# Patient Record
Sex: Female | Born: 1985 | Race: White | Hispanic: No | Marital: Married
Health system: Midwestern US, Community
[De-identification: ages and names within clinical notes are randomized; demographics above are authoritative.]

## PROBLEM LIST (undated history)

## (undated) HISTORY — PX: TUBAL LIGATION: SHX77

---
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
## 2003-01-21 ENCOUNTER — Ambulatory Visit (HOSPITAL_COMMUNITY): Admission: RE | Admit: 2003-01-21 | Discharge: 2003-01-21 | Payer: Self-pay | Admitting: *Deleted

## 2003-02-28 ENCOUNTER — Ambulatory Visit (HOSPITAL_COMMUNITY): Admission: RE | Admit: 2003-02-28 | Discharge: 2003-02-28 | Payer: Self-pay | Admitting: *Deleted

## 2003-03-25 ENCOUNTER — Inpatient Hospital Stay (HOSPITAL_COMMUNITY): Admission: AD | Admit: 2003-03-25 | Discharge: 2003-03-25 | Payer: Self-pay | Admitting: *Deleted

## 2003-04-25 ENCOUNTER — Ambulatory Visit (HOSPITAL_COMMUNITY): Admission: RE | Admit: 2003-04-25 | Discharge: 2003-04-25 | Payer: Self-pay | Admitting: *Deleted

## 2003-05-23 ENCOUNTER — Inpatient Hospital Stay (HOSPITAL_COMMUNITY): Admission: AD | Admit: 2003-05-23 | Discharge: 2003-05-24 | Payer: Self-pay | Admitting: Obstetrics and Gynecology

## 2003-07-09 ENCOUNTER — Observation Stay (HOSPITAL_COMMUNITY): Admission: AD | Admit: 2003-07-09 | Discharge: 2003-07-10 | Payer: Self-pay | Admitting: Obstetrics and Gynecology

## 2003-07-27 ENCOUNTER — Inpatient Hospital Stay (HOSPITAL_COMMUNITY): Admission: AD | Admit: 2003-07-27 | Discharge: 2003-07-27 | Payer: Self-pay | Admitting: Obstetrics and Gynecology

## 2003-07-29 ENCOUNTER — Inpatient Hospital Stay (HOSPITAL_COMMUNITY): Admission: AD | Admit: 2003-07-29 | Discharge: 2003-08-01 | Payer: Self-pay | Admitting: Obstetrics and Gynecology

## 2005-01-18 ENCOUNTER — Ambulatory Visit (HOSPITAL_COMMUNITY): Admission: RE | Admit: 2005-01-18 | Discharge: 2005-01-18 | Payer: Self-pay | Admitting: *Deleted

## 2005-02-22 ENCOUNTER — Inpatient Hospital Stay (HOSPITAL_COMMUNITY): Admission: AD | Admit: 2005-02-22 | Discharge: 2005-02-22 | Payer: Self-pay | Admitting: *Deleted

## 2005-03-09 ENCOUNTER — Inpatient Hospital Stay (HOSPITAL_COMMUNITY): Admission: AD | Admit: 2005-03-09 | Discharge: 2005-03-11 | Payer: Self-pay | Admitting: Obstetrics & Gynecology

## 2005-03-09 ENCOUNTER — Ambulatory Visit: Payer: Self-pay | Admitting: Family Medicine

## 2005-07-27 IMAGING — US US OB COMP LESS 14 WK
1 series · 14 of 28 positions shown · non-contrast
Comparison: none

CLINICAL DATA: Evaluate gestational age; uncertain LMP dates
 EARLY OBSTETRICAL ULTRASOUND WITH TRANSVAGINAL:
 Number of fetuses:  1
 Heart rate:  155
 Movement:  Yes
 FETAL BIOMETRY
 BPD:  2.0 cm  13 w 1 d
 HC:  8.0 cm  13 w 3 d
 AC:  6.6 cm  13 w 1 d
 FL:  .9 cm  13 w 4 d
 Mean GA:  13 w 1 d
 Fetal measurements and complete anatomic exam were not performed due to early gestational age.  The following anatomy was visualized:  Thalami and stomach.
 MATERNAL FINDINGS:
 Cervix:  Not evaluated

[Series 1: unknown · 0.26mm/px · 14 of 66 slices shown]
[im 3/66]
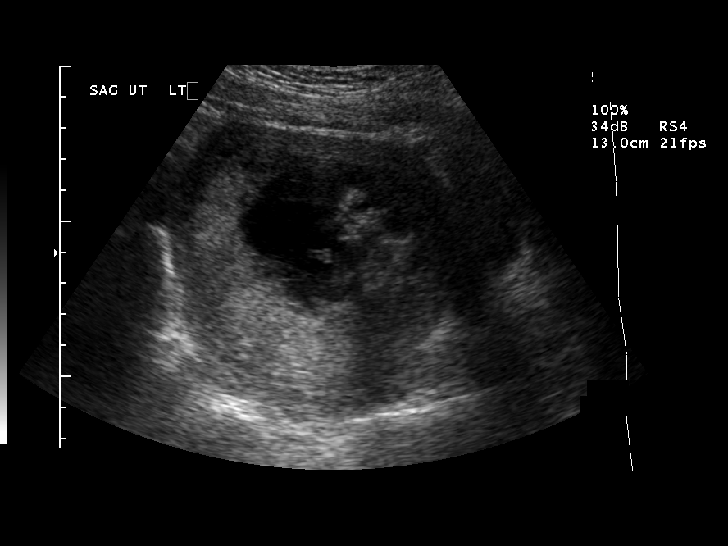
[im 8/66]
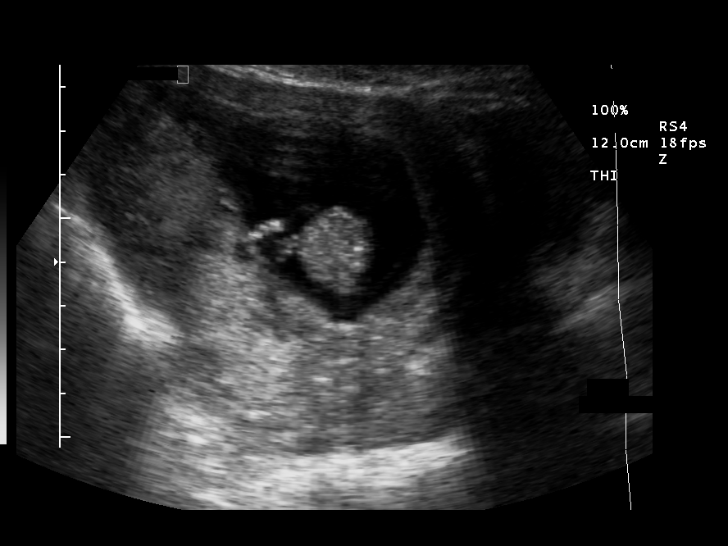
[im 13/66]
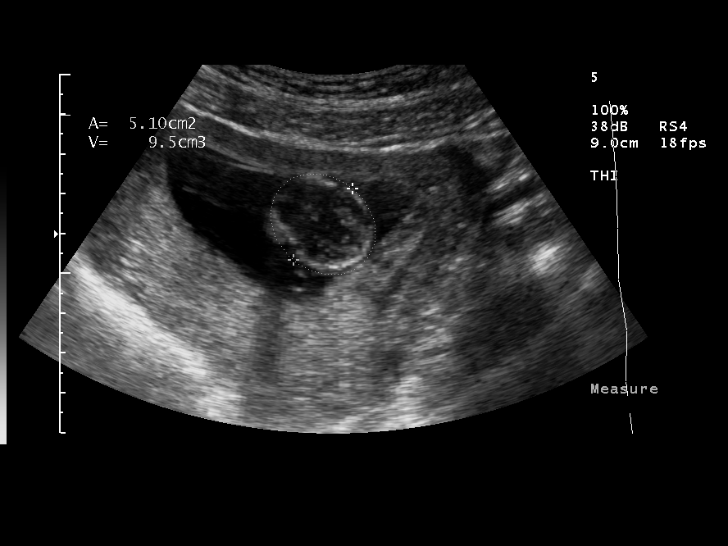
[im 17/66]
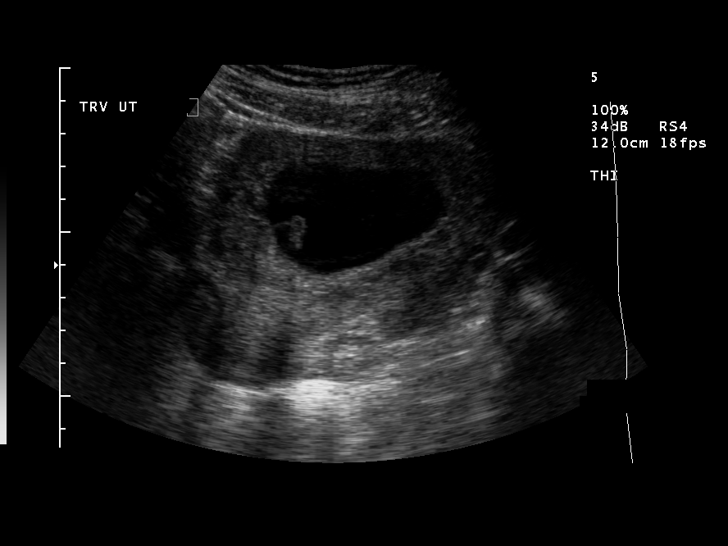
[im 22/66]
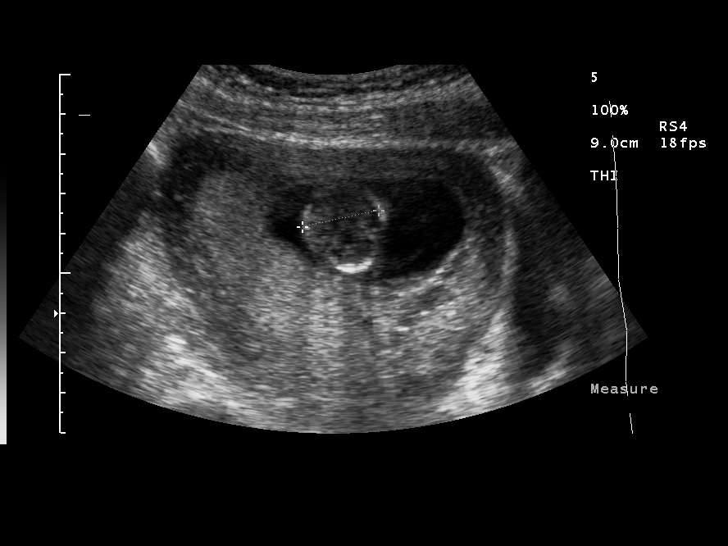
[im 27/66]
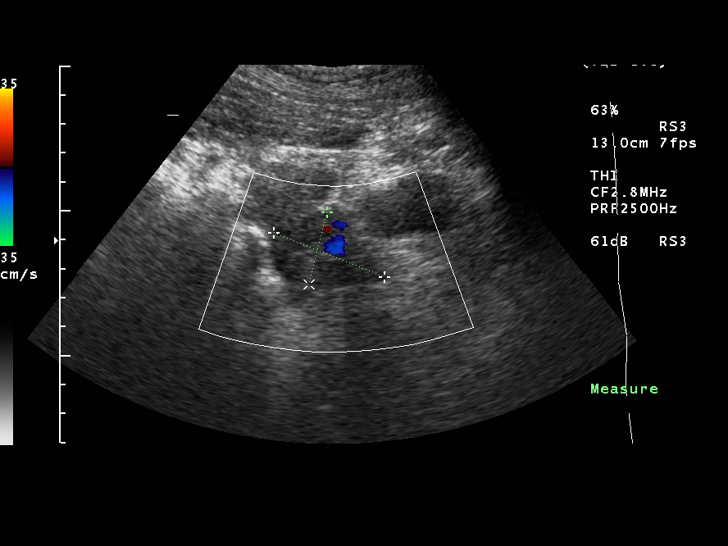
[im 32/66]
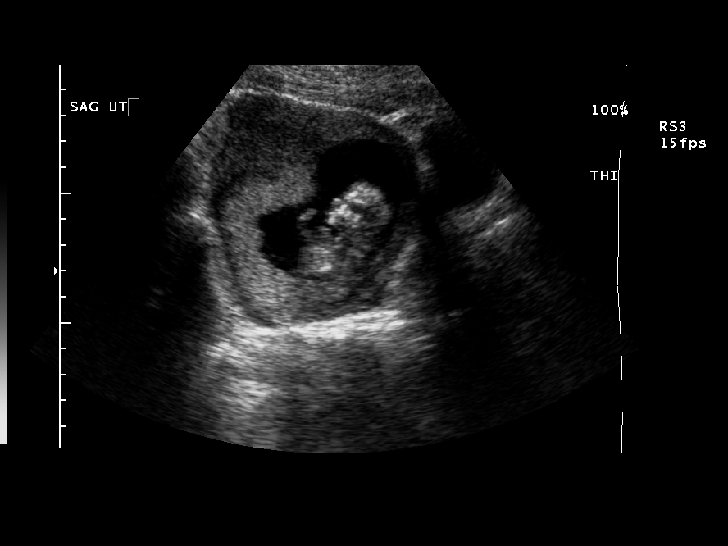
[im 37/66]
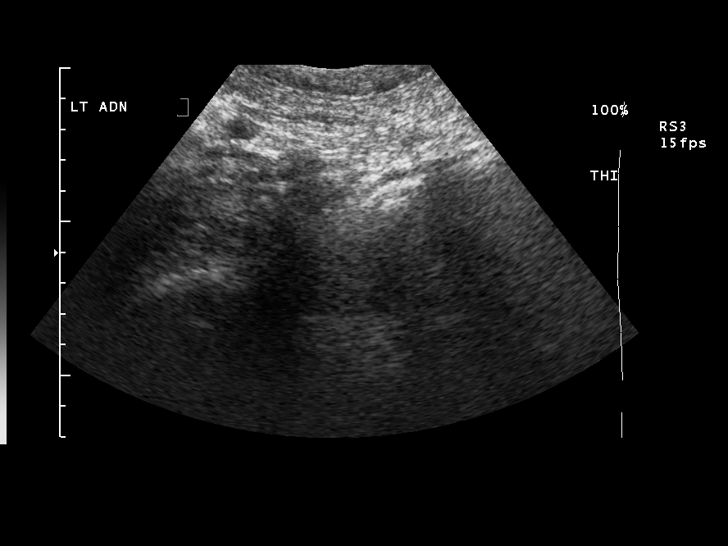
[im 41/66]
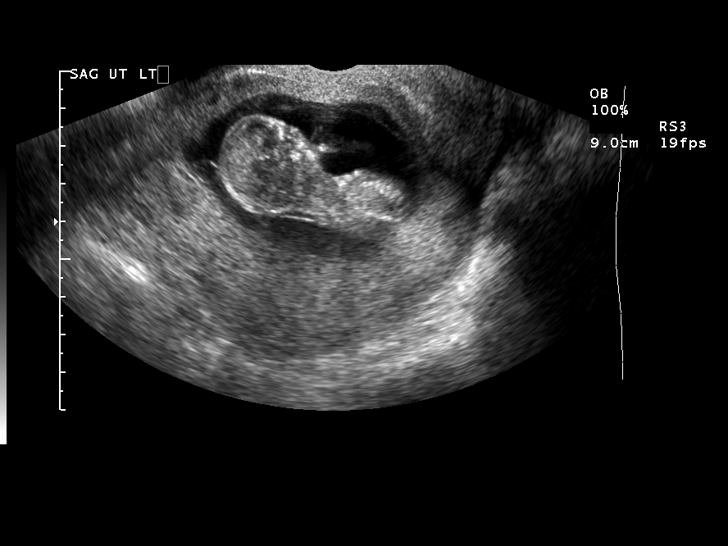
[im 46/66]
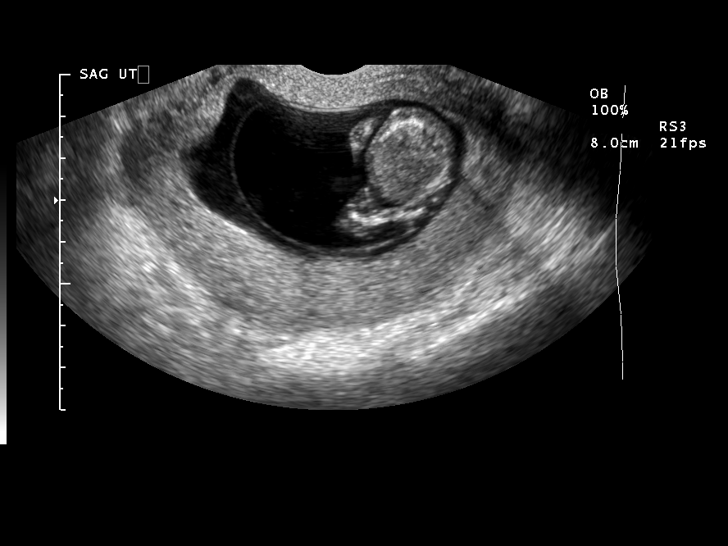
[im 51/66]
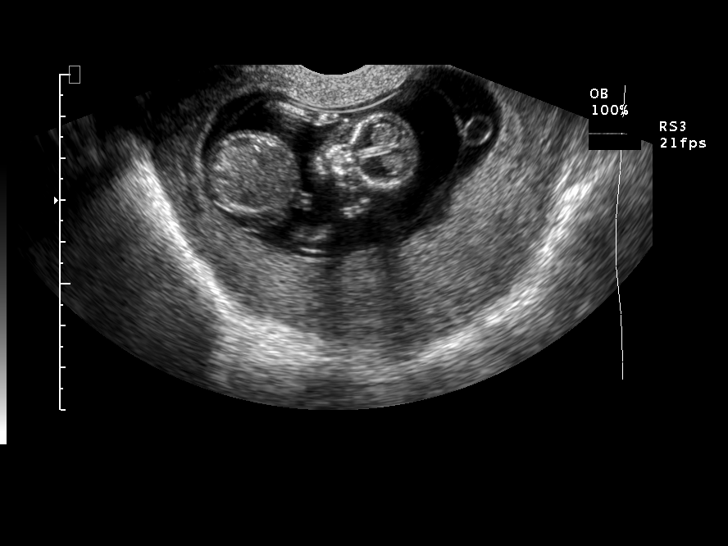
[im 56/66]
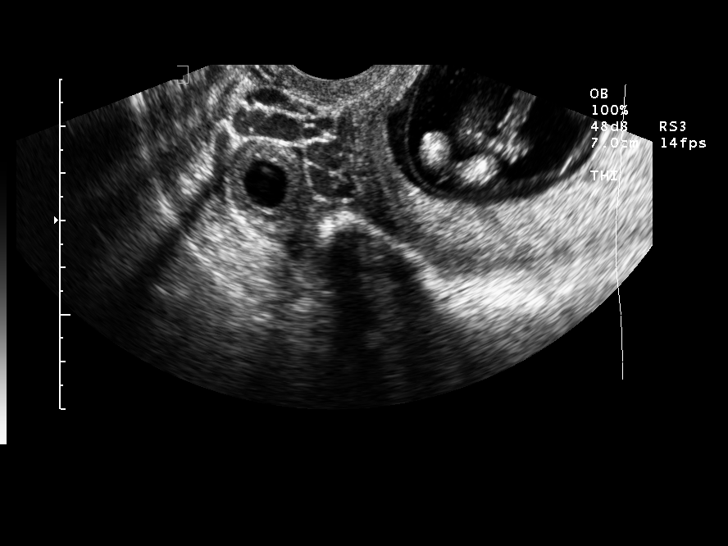
[im 61/66]
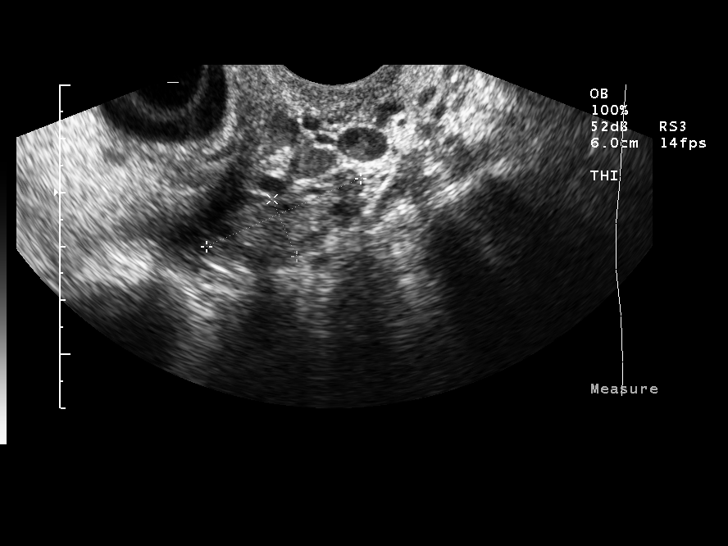
[im 66/66]
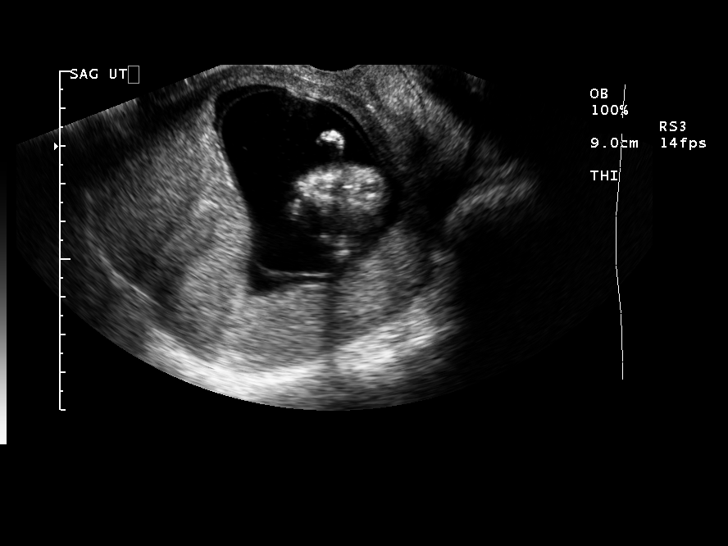

[14 of 28 positions shown; findings below may reference images not displayed]

IMPRESSION: There is a single living intrauterine gestation.  The mean gestational age by today?s ultrasound is 13 weeks 1 day with an EDC of 07/28/03.  Both adnexal regionsl are within normal limits.  There is no free pelvic fluid.  Follow-up anatomy scan at 19 ? 21 weeks is recommended.

## 2005-10-16 ENCOUNTER — Ambulatory Visit (HOSPITAL_COMMUNITY): Admission: RE | Admit: 2005-10-16 | Discharge: 2005-10-16 | Payer: Self-pay | Admitting: Gynecology

## 2005-12-10 ENCOUNTER — Ambulatory Visit (HOSPITAL_COMMUNITY): Admission: RE | Admit: 2005-12-10 | Discharge: 2005-12-10 | Payer: Self-pay | Admitting: Family Medicine

## 2005-12-25 ENCOUNTER — Inpatient Hospital Stay (HOSPITAL_COMMUNITY): Admission: AD | Admit: 2005-12-25 | Discharge: 2005-12-25 | Payer: Self-pay | Admitting: Gynecology

## 2006-04-05 ENCOUNTER — Inpatient Hospital Stay (HOSPITAL_COMMUNITY): Admission: AD | Admit: 2006-04-05 | Discharge: 2006-04-05 | Payer: Self-pay | Admitting: Gynecology

## 2006-04-20 ENCOUNTER — Ambulatory Visit: Payer: Self-pay | Admitting: *Deleted

## 2006-04-20 ENCOUNTER — Inpatient Hospital Stay (HOSPITAL_COMMUNITY): Admission: AD | Admit: 2006-04-20 | Discharge: 2006-04-20 | Payer: Self-pay | Admitting: Obstetrics & Gynecology

## 2006-04-29 ENCOUNTER — Inpatient Hospital Stay (HOSPITAL_COMMUNITY): Admission: AD | Admit: 2006-04-29 | Discharge: 2006-05-02 | Payer: Self-pay | Admitting: Gynecology

## 2006-04-29 ENCOUNTER — Ambulatory Visit: Payer: Self-pay | Admitting: *Deleted

## 2007-07-25 IMAGING — US US OB COMP +14 WK
1 series · 13 of 28 positions shown · non-contrast
Comparison: none

CLINICAL DATA: Verify gestational age.  Evaluate anatomy.

[Series 1: us ob comp +14 wk · 0.29mm/px · 13 of 90 slices shown]
[im 4/90]
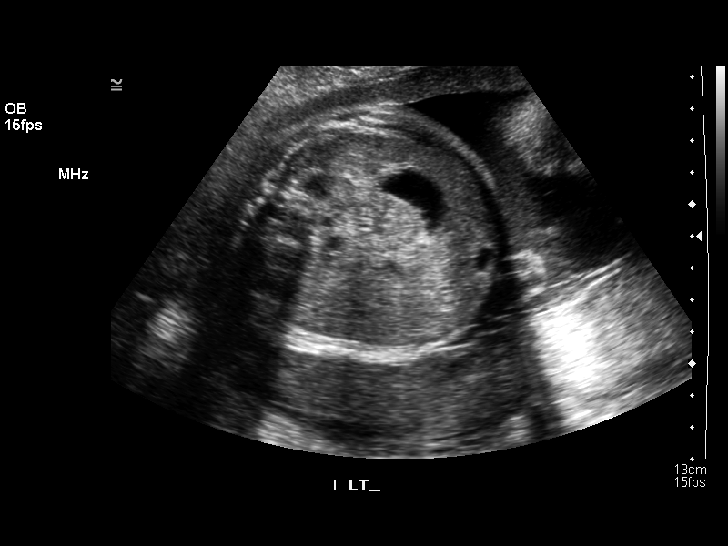
[im 10/90]
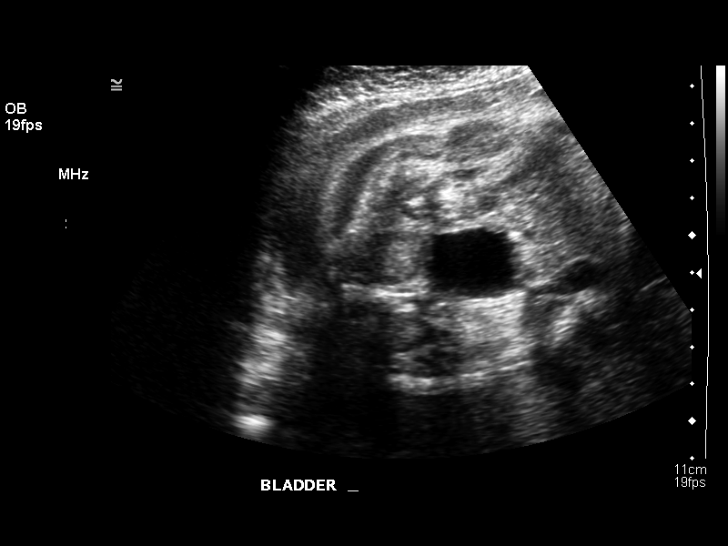
[im 17/90]
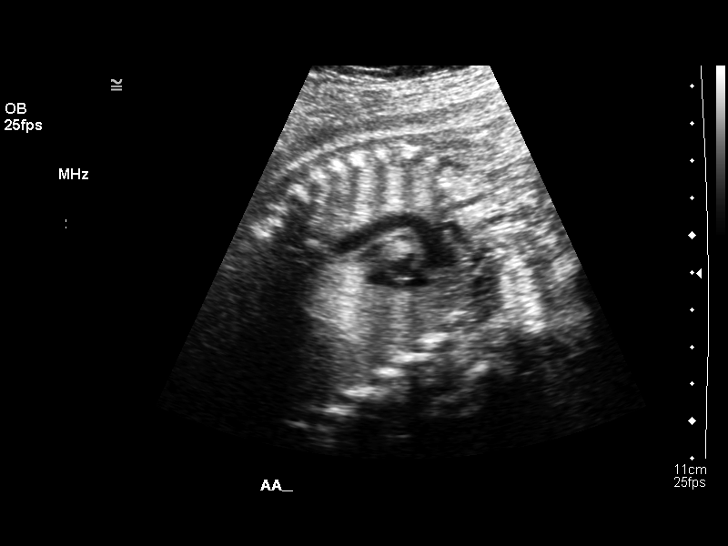
[im 24/90]
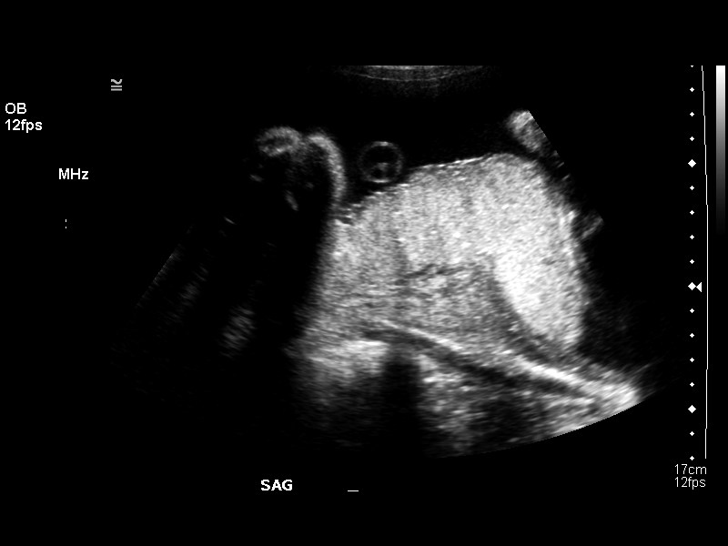
[im 30/90]
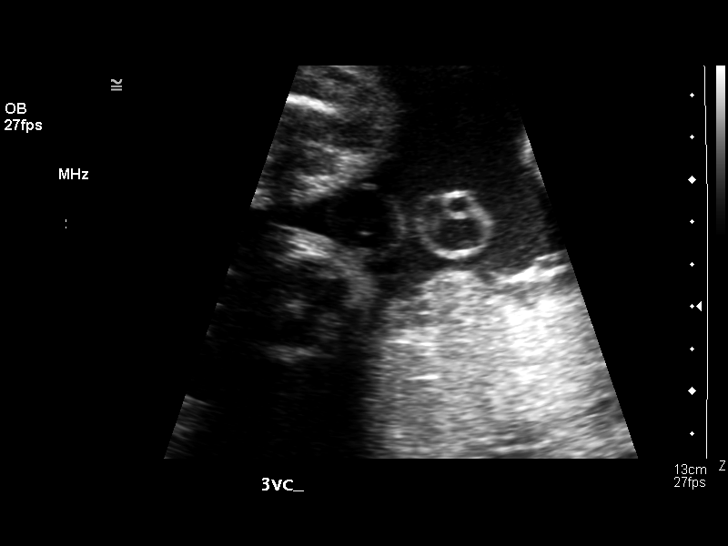
[im 37/90]
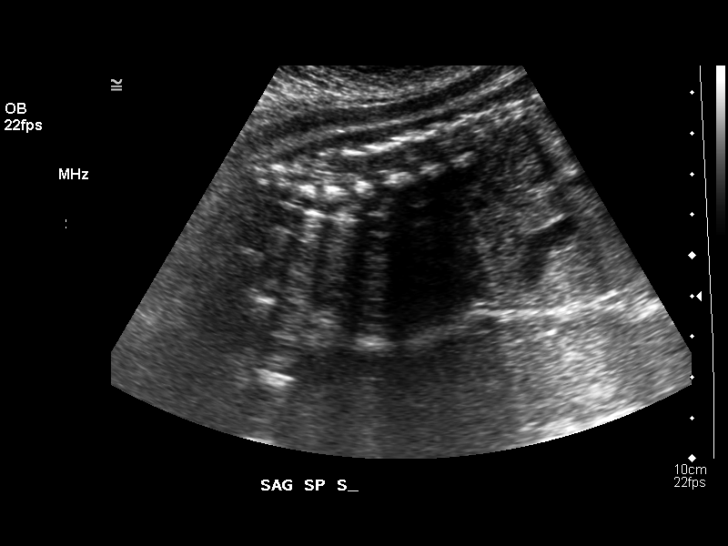
[im 47/90]
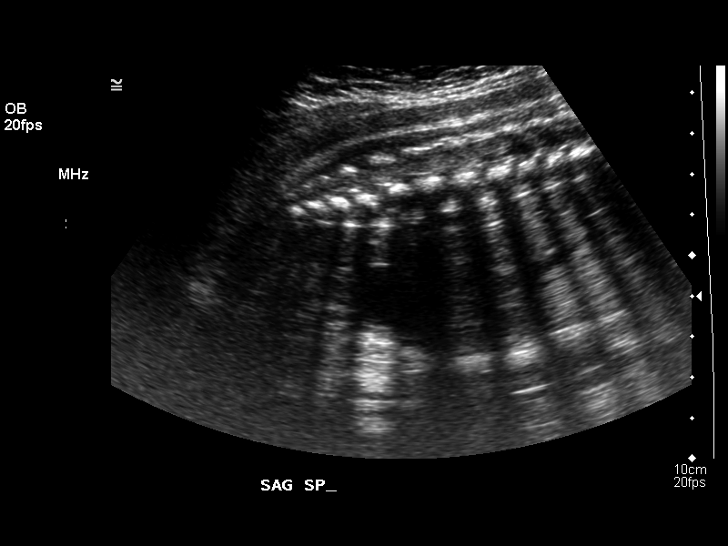
[im 53/90]
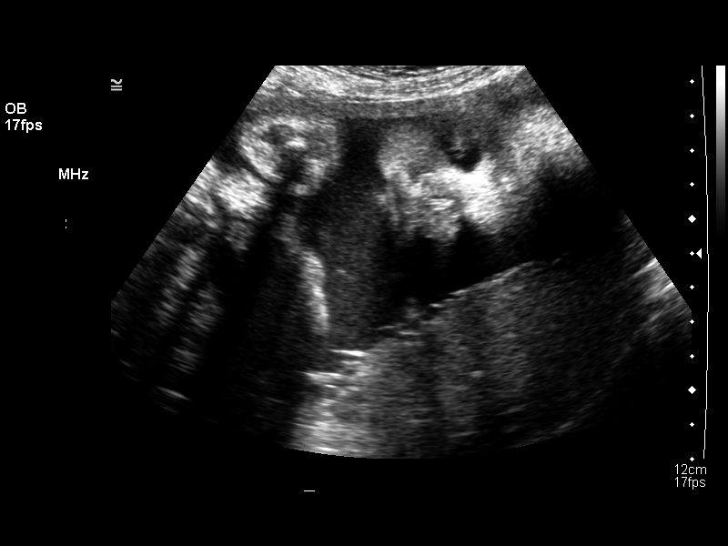
[im 60/90]
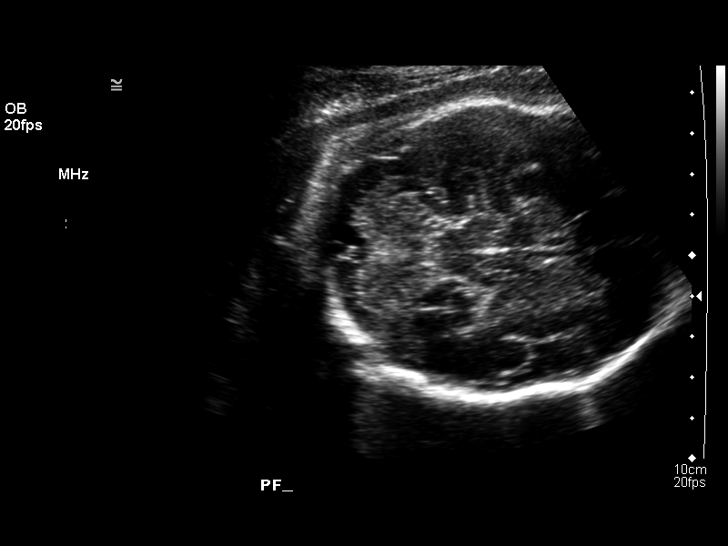
[im 66/90]
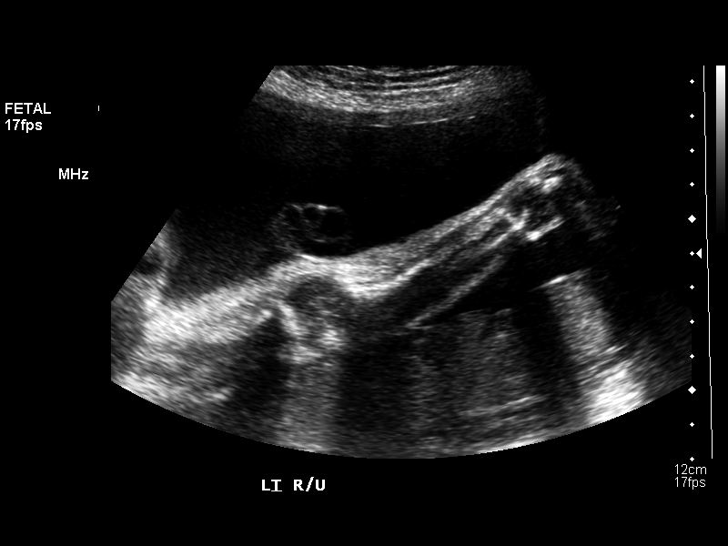
[im 73/90]
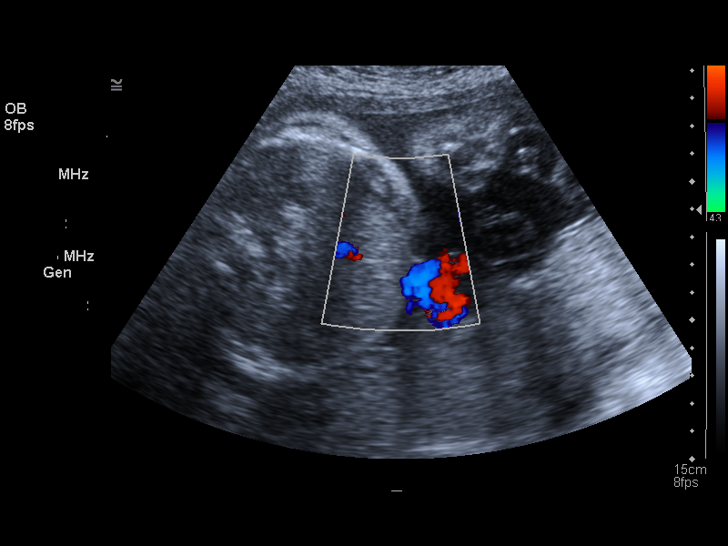
[im 80/90]
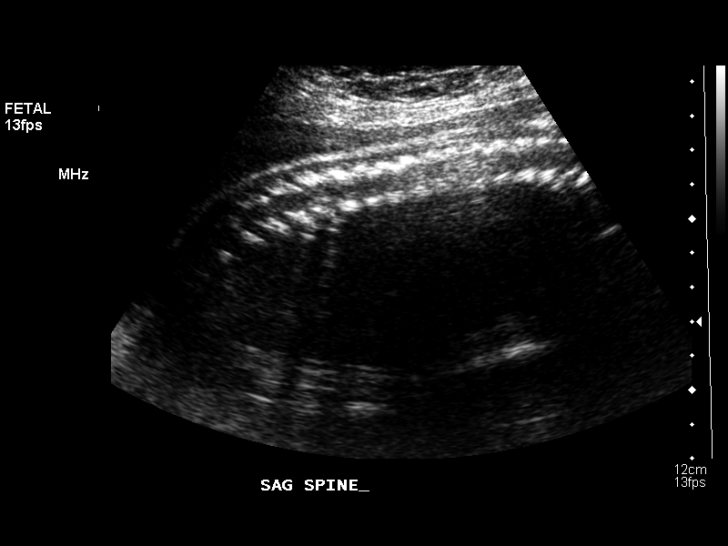
[im 86/90]
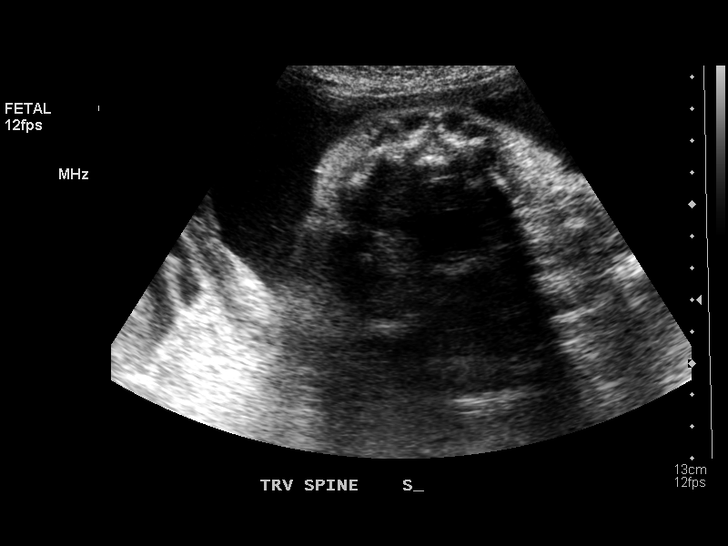

[13 of 28 positions shown; findings below may reference images not displayed]

OBSTETRICAL ULTRASOUND:
 Number of Fetuses: 1
 Heart Rate:  144
 Movement:  Yes
 Breathing:  Yes  
 Presentation:  Cephalic
 Placental Location:  Posterior, left lateral 
 Grade: II
 Previa:  No
 Amniotic Fluid (Subjective):  Normal
 Amniotic Fluid (Objective):   13.0 cm AFI (5th -95th%ile = 9.0 ? 23.4 cm for 30 wks)

 FETAL BIOMETRY
 BPD:   7.5 cm  30 w 2 d
 HC:   27.7 cm   30 w 3 d
 AC:   25.8 cm  30 w 0 d
 FL:    5.8 cm  30 w 2 d

 MEAN GA:  30 w 2 d

 EFW: 1790 g (H) 50th ? 75th%ile (2921 ? 2522 g) For 30 wks

 FETAL ANATOMY
 Lateral Ventricles:    Visualized 
 Thalami/CSP:      Visualized 
 Posterior Fossa:  Visualized 
 Nuchal Region:    N/A
 Spine:      Visualized 
 4 Chamber Heart on Left:      Visualized 
 Stomach on Left:      Visualized 
 3 Vessel Cord:    Visualized 
 Cord Insertion site:    Visualized 
 Kidneys:  Visualized 
 Bladder:  Visualized   
 Extremities:      Limited

 ADDITIONAL ANATOMY VISUALIZED:  LVOT, upper lip, diaphragm, aortic arch, and female genitalia.

 Evaluation limited by:  Advanced gestational age 

 MATERNAL UTERINE AND ADNEXAL FINDINGS
 Cervix: 3.2 cm Transabdominally
IMPRESSION: 1.  Single living intrauterine fetus in cephalic presentation with subjectively and quantitatively normal amniotic fluid volume.  Estimated mean gestational age is 30 weeks 2 days which correlates closely with the reported assigned gestational age of 30 weeks 0 days.  
 2.  Visualized fetal anatomy is unremarkable although exam is limited by the advanced gestational age and fetal position.

## 2009-06-20 ENCOUNTER — Emergency Department (HOSPITAL_COMMUNITY): Admission: EM | Admit: 2009-06-20 | Discharge: 2009-06-20 | Payer: Self-pay | Admitting: Emergency Medicine

## 2009-08-25 ENCOUNTER — Emergency Department (HOSPITAL_COMMUNITY): Admission: EM | Admit: 2009-08-25 | Discharge: 2009-08-26 | Payer: Self-pay | Admitting: Emergency Medicine

## 2010-06-11 LAB — POCT I-STAT, CHEM 8
BUN: 10 mg/dL (ref 6–23)
Calcium, Ion: 1.23 mmol/L (ref 1.12–1.32)
Chloride: 105 mEq/L (ref 96–112)
Creatinine, Ser: 0.8 mg/dL (ref 0.4–1.2)
Glucose, Bld: 97 mg/dL (ref 70–99)
HCT: 39 % (ref 36.0–46.0)
Hemoglobin: 13.3 g/dL (ref 12.0–15.0)
Potassium: 4.2 mEq/L (ref 3.5–5.1)
Sodium: 141 mEq/L (ref 135–145)
TCO2: 27 mmol/L (ref 0–100)

## 2010-06-11 LAB — URINALYSIS, ROUTINE W REFLEX MICROSCOPIC
Bilirubin Urine: NEGATIVE
Glucose, UA: NEGATIVE mg/dL
Hgb urine dipstick: NEGATIVE
Ketones, ur: NEGATIVE mg/dL
Nitrite: NEGATIVE
Protein, ur: NEGATIVE mg/dL
Specific Gravity, Urine: 1.02 (ref 1.005–1.030)
Urobilinogen, UA: 0.2 mg/dL (ref 0.0–1.0)
pH: 6 (ref 5.0–8.0)

## 2010-06-11 LAB — GC/CHLAMYDIA PROBE AMP, GENITAL
Chlamydia, DNA Probe: NEGATIVE
GC Probe Amp, Genital: NEGATIVE

## 2010-06-11 LAB — DIFFERENTIAL
Basophils Absolute: 0.1 10*3/uL (ref 0.0–0.1)
Basophils Relative: 1 % (ref 0–1)
Eosinophils Absolute: 0.1 10*3/uL (ref 0.0–0.7)
Eosinophils Relative: 1 % (ref 0–5)
Lymphocytes Relative: 25 % (ref 12–46)
Lymphs Abs: 2.2 10*3/uL (ref 0.7–4.0)
Monocytes Absolute: 0.5 10*3/uL (ref 0.1–1.0)
Monocytes Relative: 6 % (ref 3–12)
Neutro Abs: 6 10*3/uL (ref 1.7–7.7)
Neutrophils Relative %: 68 % (ref 43–77)

## 2010-06-11 LAB — CBC
HCT: 36.7 % (ref 36.0–46.0)
Hemoglobin: 12.3 g/dL (ref 12.0–15.0)
MCHC: 33.5 g/dL (ref 30.0–36.0)
MCV: 86.5 fL (ref 78.0–100.0)
Platelets: 282 10*3/uL (ref 150–400)
RBC: 4.24 MIL/uL (ref 3.87–5.11)
RDW: 15.1 % (ref 11.5–15.5)
WBC: 8.8 10*3/uL (ref 4.0–10.5)

## 2010-06-11 LAB — PREGNANCY, URINE: Preg Test, Ur: NEGATIVE

## 2010-06-11 LAB — WET PREP, GENITAL
Trich, Wet Prep: NONE SEEN
Yeast Wet Prep HPF POC: NONE SEEN

## 2010-06-11 LAB — URINE MICROSCOPIC-ADD ON

## 2010-08-10 NOTE — Op Note (Signed)
NAMECONSTANCIA, [name]             ACCOUNT NO.:  [id number]   MEDICAL RECORD NO.:  [id number]          PATIENT TYPE:  INP   LOCATION:  9120                          FACILITY:  [facility]   PHYSICIAN:  Tracy L. Mayford Knife, M.D.DATE OF BIRTH:  07-Jan-1986   DATE OF PROCEDURE:  05/01/2006  DATE OF DISCHARGE:                               OPERATIVE REPORT   ATTENDING PHYSICIAN:  Phil D. Okey Dupre, M.D.   PREOPERATIVE DIAGNOSIS:  Desires permanent sterilization.   POSTOPERATIVE DIAGNOSIS:  Desires permanent sterilization.   PROCEDURE:  Postpartum tubal ligation with Filshie clips.   SURGEON:  Javier Glazier. Okey Dupre, M.D.   ASSISTANT:  Marc Morgans. Mayford Knife, M.D.   ANESTHESIA:  Epidural.   ESTIMATED BLOOD LOSS:  Minimal.   SPECIMENS:  None   COMPLICATIONS:  None.   DESCRIPTION OF PROCEDURE:  Patient was taken to the operating room where  epidural anesthesia was found to be adequate.  She was then prepped and  draped in the usual sterile fashion.  A 3 cm infraumbilical incision was  made, carried through the fascia and peritoneum of the abdomen was  entered.  The left fallopian tube was then grasped with a Babcock clamp,  followed out the fimbria.  Next, a Filshie clip was placed in the mid  portion.  Excellent hemostasis was maintained.  Fallopian tube was  returned to the abdomen.  Attention was then turned to the right  fallopian tube which, in a similar fashion was grasped with the Babcock  clamp and followed out to the fimbria.  Next, the Filshie clip was  placed in the mid portion.  Excellent hemostasis was maintained.  Next,  the fascia was closed with 0 Vicryl in a running fashion. Skin was  closed with Monocryl and Dermabond.  There were no complications and  patient was taken to the recovery room in stable condition.           ______________________________  Marc Morgans Mayford Knife, M.D.     TLW/MEDQ  D:  05/01/2006  T:  05/01/2006  Job:  [id number]

## 2010-10-18 ENCOUNTER — Emergency Department (HOSPITAL_COMMUNITY)
Admission: EM | Admit: 2010-10-18 | Discharge: 2010-10-18 | Disposition: A | Discharge status: Home / Self Care | Payer: Self-pay | Attending: Emergency Medicine | Admitting: Emergency Medicine

## 2010-10-18 DIAGNOSIS — N390 Urinary tract infection, site not specified: Secondary | ICD-10-CM | POA: Insufficient documentation

## 2010-10-18 DIAGNOSIS — R509 Fever, unspecified: Secondary | ICD-10-CM | POA: Insufficient documentation

## 2010-10-18 DIAGNOSIS — R109 Unspecified abdominal pain: Secondary | ICD-10-CM | POA: Insufficient documentation

## 2010-10-18 LAB — URINE MICROSCOPIC-ADD ON

## 2010-10-18 LAB — URINALYSIS, ROUTINE W REFLEX MICROSCOPIC
Bilirubin Urine: NEGATIVE
Glucose, UA: NEGATIVE mg/dL
Ketones, ur: NEGATIVE mg/dL
Nitrite: POSITIVE — AB
Protein, ur: 30 mg/dL — AB
Specific Gravity, Urine: 1.015 (ref 1.005–1.030)
Urobilinogen, UA: 1 mg/dL (ref 0.0–1.0)
pH: 6.5 (ref 5.0–8.0)

## 2010-10-18 LAB — POCT PREGNANCY, URINE: Preg Test, Ur: NEGATIVE

## 2011-01-23 ENCOUNTER — Other Ambulatory Visit (HOSPITAL_COMMUNITY): Payer: Self-pay | Admitting: Specialist

## 2011-01-23 DIAGNOSIS — N971 Female infertility of tubal origin: Secondary | ICD-10-CM

## 2011-02-04 ENCOUNTER — Ambulatory Visit (HOSPITAL_COMMUNITY): Payer: Self-pay

## 2012-12-09 ENCOUNTER — Encounter (HOSPITAL_COMMUNITY): Payer: Self-pay | Admitting: *Deleted

## 2012-12-09 ENCOUNTER — Emergency Department (HOSPITAL_COMMUNITY)
Admission: EM | Admit: 2012-12-09 | Discharge: 2012-12-10 | Disposition: A | Discharge status: Home / Self Care | Payer: Self-pay | Attending: Emergency Medicine | Admitting: Emergency Medicine

## 2012-12-09 DIAGNOSIS — L989 Disorder of the skin and subcutaneous tissue, unspecified: Secondary | ICD-10-CM

## 2012-12-09 DIAGNOSIS — M259 Joint disorder, unspecified: Secondary | ICD-10-CM | POA: Insufficient documentation

## 2012-12-09 DIAGNOSIS — Z711 Person with feared health complaint in whom no diagnosis is made: Secondary | ICD-10-CM

## 2012-12-09 NOTE — ED Notes (Signed)
The pt has a small l;esion to her lt upper arm that she has had for 6 months and it has never gone away

## 2012-12-10 ENCOUNTER — Emergency Department (HOSPITAL_COMMUNITY)
Admission: EM | Admit: 2012-12-10 | Discharge: 2012-12-10 | Disposition: A | Discharge status: Home / Self Care | Payer: Self-pay | Source: Home / Self Care | Attending: Family Medicine | Admitting: Family Medicine

## 2012-12-10 ENCOUNTER — Other Ambulatory Visit (HOSPITAL_COMMUNITY)
Admission: RE | Admit: 2012-12-10 | Discharge: 2012-12-10 | Disposition: A | Discharge status: Home / Self Care | Payer: Self-pay | Source: Ambulatory Visit | Attending: Family Medicine | Admitting: Family Medicine

## 2012-12-10 ENCOUNTER — Encounter (HOSPITAL_COMMUNITY): Payer: Self-pay | Admitting: Emergency Medicine

## 2012-12-10 DIAGNOSIS — L989 Disorder of the skin and subcutaneous tissue, unspecified: Secondary | ICD-10-CM | POA: Insufficient documentation

## 2012-12-10 DIAGNOSIS — D2360 Other benign neoplasm of skin of unspecified upper limb, including shoulder: Secondary | ICD-10-CM

## 2012-12-10 DIAGNOSIS — D2362 Other benign neoplasm of skin of left upper limb, including shoulder: Secondary | ICD-10-CM

## 2012-12-10 MED ORDER — TRAMADOL HCL 50 MG PO TABS: 50.0000 mg | ORAL_TABLET | Freq: Four times a day (QID) | ORAL | Status: DC | PRN

## 2012-12-10 NOTE — ED Notes (Signed)
C/o left arm mass.   Patient did go to the hospital last night.

## 2012-12-10 NOTE — ED Notes (Signed)
Pt reports left shoulder lesion for 6 months. States lesion started out as a small red bump but is now larger, hard, and black. Denies pain at the site.

## 2012-12-10 NOTE — ED Provider Notes (Signed)
CSN: [id number]     Arrival date & time 12/09/12  2306 History   First MD Initiated Contact with Patient 12/10/12 0019     Chief Complaint  Patient presents with  . lesion lt upper arm    (Consider location/radiation/quality/duration/timing/severity/associated sxs/prior Treatment) The history is provided by the patient and medical records. No language interpreter was used.    Kristen [name] is a 27 y.o. female  with no medical Hx presents to the Emergency Department complaining of gradual, persistent, progressively worsening lesion to the Left shoulder onset more than 6 months ago.  Pt reports that it was initially a raised red bump that was painless.  Pt reports it turned black several weeks ago and became painful.  No drainage from the site at any point.  . Nothing seems to make the symptoms better or worse.  Pt denies fever, chills, headache, neck pain, swelling or pain to the shoulder pain, abd pain, N/V/D, weight loss, night sweats, decreased appetite.  Pt reports long family hx of skin cancer, but she is unsure which kind.  She reports that she never uses sunscreen.     History reviewed. No pertinent past medical history. History reviewed. No pertinent past surgical history. No family history on file. History  Substance Use Topics  . Smoking status: Never Smoker   . Smokeless tobacco: Not on file  . Alcohol Use: No   OB History   Grav Para Term Preterm Abortions TAB SAB Ect Mult Living                 Review of Systems  Constitutional: Negative for fever, diaphoresis, appetite change, fatigue and unexpected weight change.  HENT: Negative for mouth sores and neck stiffness.   Eyes: Negative for visual disturbance.  Respiratory: Negative for cough, chest tightness, shortness of breath and wheezing.   Cardiovascular: Negative for chest pain.  Gastrointestinal: Negative for nausea, vomiting, abdominal pain, diarrhea and constipation.  Endocrine: Negative for polydipsia,  polyphagia and polyuria.  Genitourinary: Negative for dysuria, urgency, frequency and hematuria.  Musculoskeletal: Negative for back pain.  Skin: Positive for color change. Negative for rash.       Skin lesion  Allergic/Immunologic: Negative for immunocompromised state.  Neurological: Negative for syncope, light-headedness and headaches.  Hematological: Does not bruise/bleed easily.  Psychiatric/Behavioral: Negative for sleep disturbance. The patient is not nervous/anxious.     Allergies  Review of patient's allergies indicates not on file.  Home Medications  No current outpatient prescriptions on file. BP 133/82  Pulse 73  Temp(Src) 98.2 F (36.8 C)  Resp 16  SpO2 97% Physical Exam  Nursing note and vitals reviewed. Constitutional: She appears well-developed and well-nourished. No distress.  Awake, alert, nontoxic appearance  HENT:  Head: Normocephalic and atraumatic.  Mouth/Throat: Oropharynx is clear and moist. No oropharyngeal exudate.  Eyes: Conjunctivae are normal. No scleral icterus.  Neck: Normal range of motion. Neck supple.  Cardiovascular: Normal rate, regular rhythm, normal heart sounds and intact distal pulses.   No murmur heard. Pulmonary/Chest: Effort normal and breath sounds normal. No respiratory distress. She has no wheezes.  Abdominal: Soft. Bowel sounds are normal. She exhibits no distension. There is no tenderness.  Musculoskeletal: Normal range of motion. She exhibits no edema.  Neurological: She is alert.  Speech is clear and goal oriented Moves extremities without ataxia  Skin: Skin is warm and dry. She is not diaphoretic.  2cm lesion to the left shoulder; symmetric, irregular borders, dark purple with variation in color,  raised No induration, erythema or fluctuance  Psychiatric: She has a normal mood and affect.    ED Course  Procedures (including critical care time) Labs Review Labs Reviewed - No data to display Imaging Review No results  found.  MDM   1. Skin lesion of left arm   2. Concern about skin cancer without diagnosis      Kristen [name] presents with lesion to the L shoulder increasing in size and changing in color for the last 6 months.  Pt with family hx of skin cancer (unknown what kind) and no use of sunscreen.  Pt skin lesion concerning for melanoma with 4/5 classic characteristics.  Pt to be referred to PCP and dermatology for biopsy and treatment.    It has been determined that no acute conditions requiring further emergency intervention are present at this time. The patient/guardian have been advised of the diagnosis and plan. We have discussed signs and symptoms that warrant return to the ED, such as changes or worsening in symptoms.   Vital signs are stable at discharge.   BP 133/82  Pulse 73  Temp(Src) 98.2 F (36.8 C)  Resp 16  SpO2 97%  Patient/guardian has voiced understanding and agreed to follow-up with the PCP or specialist.        Dierdre Forth, PA-C 12/10/12 [phone]

## 2012-12-10 NOTE — ED Provider Notes (Signed)
Medical screening examination/treatment/procedure(s) were performed by non-physician practitioner and as supervising physician I was immediately available for consultation/collaboration.   Brandt Loosen, MD 12/10/12 [phone]

## 2012-12-10 NOTE — ED Provider Notes (Signed)
CSN: [id number]     Arrival date & time 12/10/12  1111 History   First MD Initiated Contact with Patient 12/10/12 1252     Chief Complaint  Patient presents with  . Mass   (Consider location/radiation/quality/duration/timing/severity/associated sxs/prior Treatment) HPI Nevus left upper arm. Patient has had a increasingly large dysplastic appearing nevus on her left upper arm worsening over the past 6 months. Pt reports that it was initially a raised red bump that was painless. Pt reports it turned black several weeks ago and became painful. No drainage from the site at any point. . Nothing seems to make the symptoms better or worse. Pt denies fever, chills, headache, neck pain, swelling or pain to the shoulder pain, abd pain, N/V/D, weight loss, night sweats, decreased appetite. Pt reports long family hx of skin cancer, but she is unsure which kind. She reports that she never uses sunscreen.   She was seen in the emergency room on the 17th and was referred to dermatology. The next available appointment for dermatology is after January. She would like this biopsied if possible today  History reviewed. No pertinent past medical history. History reviewed. No pertinent past surgical history. History reviewed. No pertinent family history. History  Substance Use Topics  . Smoking status: Never Smoker   . Smokeless tobacco: Not on file  . Alcohol Use: No   OB History   Grav Para Term Preterm Abortions TAB SAB Ect Mult Living                 Review of Systems  Allergies  Review of patient's allergies indicates no known allergies.  Home Medications   Current Outpatient Rx  Name  Route  Sig  Dispense  Refill  . traMADol (ULTRAM) 50 MG tablet   Oral   Take 1 tablet (50 mg total) by mouth every 6 (six) hours as needed for pain.   15 tablet   0    BP 122/81  Pulse 69  Temp(Src) 97.7 F (36.5 C) (Oral)  Resp 18  SpO2 98%  LMP 12/09/2012 Physical Exam Large raised dark purple  lesion left upper arm. Approximately 1 cm  By 0.5 cm.  ED Course  Procedures  Excisional Skin biopsy. Consent obtained and timeout performed.  Area cleaned with alcohol and cold spray applied 3 mL of lidocaine with epinephrine were checked underneath the nevus achieving good anesthesia.  2 mm borders extended to elliptical shape were drawn on the skin. The skin was prepped with Betadine and sterile drapes were applied. The nevus was excised in elliptical fashion including the full thickness of the dermis. 4 horizontal mattress sutures using 4-0 Prolene were used to close the wound.  2 simple interrupted sutures were used as well. Antibiotic ointment and a dressing was applied. Patient tolerated the procedure well    MDM   1. Dysplastic nevus of upper extremity, left    Dysplastic appearing nevus possible melanoma left upper arm excised and sent to histopathology for analysis.  Patient will return in one week for suture removal and pathology followup.  She will follow up sooner as needed Discussed warning signs or symptoms. Please see discharge instructions. Patient expresses understanding.     Rodolph Bong, MD 12/10/12 1414

## 2012-12-15 ENCOUNTER — Telehealth (HOSPITAL_COMMUNITY): Payer: Self-pay | Admitting: Family Medicine

## 2012-12-15 NOTE — ED Notes (Signed)
Called with path results.  Left a message  Rodolph Bong, MD 12/15/12 [phone]

## 2012-12-16 NOTE — ED Notes (Signed)
Patient called back,.  Informed of path results.  She will f/u later this week for suture removal.   Rodolph Bong, MD 12/16/12 [phone]

## 2012-12-17 ENCOUNTER — Encounter (HOSPITAL_COMMUNITY): Payer: Self-pay | Admitting: *Deleted

## 2012-12-17 ENCOUNTER — Emergency Department (INDEPENDENT_AMBULATORY_CARE_PROVIDER_SITE_OTHER)
Admission: EM | Admit: 2012-12-17 | Discharge: 2012-12-17 | Disposition: A | Discharge status: Home / Self Care | Payer: Self-pay | Source: Home / Self Care | Attending: Family Medicine | Admitting: Family Medicine

## 2012-12-17 DIAGNOSIS — Z4802 Encounter for removal of sutures: Secondary | ICD-10-CM

## 2012-12-17 NOTE — ED Notes (Signed)
Pt  Is  Here  For  Suture  Removal    

## 2012-12-17 NOTE — ED Provider Notes (Signed)
CSN: [id number]     Arrival date & time 12/17/12  0805 History   First MD Initiated Contact with Patient 12/17/12 [phone]     Chief Complaint  Patient presents with  . Suture / Staple Removal   (Consider location/radiation/quality/duration/timing/severity/associated sxs/prior Treatment) HPI Suture removal and followup left arm nevus. Patient had a dysplastic appearing nevus removed as an excisional biopsy from her left arm one week ago. 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 She feels well and has been using ointment on her wound. No fevers or chills.  History reviewed. No pertinent past medical history. History reviewed. No pertinent past surgical history. History reviewed. No pertinent family history. History  Substance Use Topics  . Smoking status: Never Smoker   . Smokeless tobacco: Not on file  . Alcohol Use: No   OB History   Grav Para Term Preterm Abortions TAB SAB Ect Mult Living                 Review of Systems  Allergies  Review of patient's allergies indicates no known allergies.  Home Medications   Current Outpatient Rx  Name  Route  Sig  Dispense  Refill  . traMADol (ULTRAM) 50 MG tablet   Oral   Take 1 tablet (50 mg total) by mouth every 6 (six) hours as needed for pain.   15 tablet   0    BP 120/72  Pulse 78  Temp(Src) 98.6 F (37 C) (Oral)  Resp 18  SpO2 100%  LMP 12/09/2012 Physical Exam Gen.: Well no acute distress Skin: Well-appearing wound with no surrounding erythema or induration no pus.   ED Course  Procedures:  All sutures were moved and 4 Steri-Strips were applied using benzoin tincture as an adhesive.    MDM   1. Visit for suture removal    F/u as needed.  I gave patient a path report Refer to social worker for enrollment in the community access program.  Discussed warning signs or symptoms. Please see discharge instructions. Patient expresses understanding.     Rodolph Bong, MD 12/17/12 [phone]

## 2012-12-23 ENCOUNTER — Ambulatory Visit: Payer: Self-pay | Admitting: Family Medicine

## 2014-01-29 ENCOUNTER — Emergency Department (HOSPITAL_COMMUNITY)
Admission: EM | Admit: 2014-01-29 | Discharge: 2014-01-29 | Disposition: A | Discharge status: Home / Self Care | Payer: Medicaid Other | Attending: Emergency Medicine | Admitting: Emergency Medicine

## 2014-01-29 ENCOUNTER — Encounter (HOSPITAL_COMMUNITY): Payer: Self-pay | Admitting: Oncology

## 2014-01-29 DIAGNOSIS — Z79899 Other long term (current) drug therapy: Secondary | ICD-10-CM | POA: Insufficient documentation

## 2014-01-29 DIAGNOSIS — M79605 Pain in left leg: Secondary | ICD-10-CM | POA: Diagnosis present

## 2014-01-29 DIAGNOSIS — G5702 Lesion of sciatic nerve, left lower limb: Secondary | ICD-10-CM | POA: Diagnosis not present

## 2014-01-29 MED ORDER — PREDNISONE 20 MG PO TABS
60.0000 mg | ORAL_TABLET | Freq: Once | ORAL | Status: AC
  Administered 2014-01-29: 60 mg via ORAL
  Filled 2014-01-29: qty 3

## 2014-01-29 MED ORDER — CYCLOBENZAPRINE HCL 5 MG PO TABS: 5.0000 mg | ORAL_TABLET | Freq: Three times a day (TID) | ORAL | Status: AC

## 2014-01-29 MED ORDER — CYCLOBENZAPRINE HCL 10 MG PO TABS
5.0000 mg | ORAL_TABLET | Freq: Once | ORAL | Status: AC
  Administered 2014-01-29: 5 mg via ORAL
  Filled 2014-01-29: qty 1

## 2014-01-29 MED ORDER — PREDNISONE 20 MG PO TABS: ORAL_TABLET | ORAL | Status: AC

## 2014-01-29 MED ORDER — HYDROCODONE-ACETAMINOPHEN 5-325 MG PO TABS: 1.0000 | ORAL_TABLET | Freq: Every evening | ORAL | Status: DC | PRN

## 2014-01-29 NOTE — ED Notes (Signed)
Pt has been having a burning pain in her left leg x 1 month.  Pt was seen for the same an given tramadol which she reported did not work.  Pt describes the pain as a 7/10, burning sensation.

## 2014-01-29 NOTE — ED Provider Notes (Signed)
CSN: [id number]     Arrival date & time 01/29/14  2059 History  This chart was scribed for Manuela Neptune, NP with Jasper Riling. Alvino Chapel, MD by Edison Simon, ED Scribe. This patient was seen in room WTR8/WTR8 and the patient's care was started at 9:33 PM.    Chief Complaint  Patient presents with  . Leg Pain   The history is provided by the patient. No language interpreter was used.    HPI Comments: Kristen [name] is a 28 y.o. female who presents to the Emergency Department complaining of left leg pain with onset 1 month ago, worse since 2 weeks ago. She locates it to her lower back, down her leg, to her foot. She states she almost slipped 1 month ago but caught herself. She denies history of back injury or MVC. She states pain is worse with exertion, coughing, or laughing and is worse at night. She reports using Tramadol without improvement, which was prescribed for post-operative pain for mole removal in September. She states she does not have a PCP. She states her last period was on 10/11; she states she does not believe she is pregnant but states she might be.  History reviewed. No pertinent past medical history. Past Surgical History  Procedure Laterality Date  . Tubal ligation     History reviewed. No pertinent family history. History  Substance Use Topics  . Smoking status: Never Smoker   . Smokeless tobacco: Not on file  . Alcohol Use: No   OB History    No data available     Review of Systems  Genitourinary: Negative for dysuria and flank pain.  Musculoskeletal: Positive for back pain and arthralgias.       Left leg pain  Skin: Negative for rash.  Neurological: Negative for dizziness, weakness and numbness.  All other systems reviewed and are negative.     Allergies  Review of patient's allergies indicates no known allergies.  Home Medications   Prior to Admission medications   Medication Sig Start Date End Date Taking? Authorizing Provider  cyclobenzaprine (FLEXERIL)  5 MG tablet Take 1 tablet (5 mg total) by mouth 3 (three) times daily. 01/29/14   Garald Balding, NP  HYDROcodone-acetaminophen (NORCO/VICODIN) 5-325 MG per tablet Take 1-2 tablets by mouth at bedtime as needed. 01/29/14   Garald Balding, NP  predniSONE (DELTASONE) 20 MG tablet 3 Tabs PO Days 1-3, then 2 tabs PO Days 4-6, then 1 tab PO Day 7-9, then Half Tab PO Day 10-12 01/29/14   Garald Balding, NP  traMADol (ULTRAM) 50 MG tablet Take 1 tablet (50 mg total) by mouth every 6 (six) hours as needed for pain. 12/10/12   Gregor Hams, MD   BP 132/91 mmHg  Pulse 97  Temp(Src) 98.6 F (37 C) (Oral)  Resp 18  Ht 5\' 8"  (1.727 m)  Wt 145 lb (65.772 kg)  BMI 22.05 kg/m2  SpO2 100%  LMP 01/02/2014 Physical Exam  Constitutional: She appears well-developed and well-nourished.  HENT:  Head: Normocephalic.  Eyes: Pupils are equal, round, and reactive to light.  Neck: Normal range of motion.  Cardiovascular: Normal rate and regular rhythm.   Pulmonary/Chest: Effort normal and breath sounds normal.  Musculoskeletal: She exhibits tenderness. She exhibits no edema.       Back:  Neurological: She is alert.  Skin: Skin is warm. No rash noted. No erythema.  Nursing note and vitals reviewed.   ED Course  Procedures (including critical care time)  DIAGNOSTIC STUDIES: Oxygen Saturation is 100% on room air, normal by my interpretation.    COORDINATION OF CARE: 9:38 PM Discussed treatment plan with patient at beside, the patient agrees with the plan and has no further questions at this time.   Labs Review Labs Reviewed - No data to display  Imaging Review No results found.   EKG Interpretation None     Will treat with long Prednisone taper, muscle relaxer and Hydrocodone at night  MDM   Final diagnoses:  Sciatic nerve disease, left       I personally performed the services described in this documentation, which was scribed in my presence. The recorded information has been reviewed and is  accurate.   Garald Balding, NP 01/29/14 2149  [place] Alvino Chapel, MD 01/29/14 2348

## 2014-01-29 NOTE — Discharge Instructions (Signed)
Peripheral Neuropathy °Peripheral neuropathy is a type of nerve damage. It affects nerves that carry signals between the spinal cord and other parts of the body. These are called peripheral nerves. With peripheral neuropathy, one nerve or a group of nerves may be damaged.  °CAUSES  °Many things can damage peripheral nerves. For some people with peripheral neuropathy, the cause is unknown. Some causes include: °· Diabetes. This is the most common cause of peripheral neuropathy. °· Injury to a nerve. °· Pressure or stress on a nerve that lasts a long time. °· Too little vitamin B. Alcoholism can lead to this. °· Infections. °· Autoimmune diseases, such as multiple sclerosis and systemic lupus erythematosus. °· Inherited nerve diseases. °· Some medicines, such as cancer drugs. °· Toxic substances, such as lead and mercury. °· Too little blood flowing to the legs. °· Kidney disease. °· Thyroid disease. °SIGNS AND SYMPTOMS  °Different people have different symptoms. The symptoms you have will depend on which of your nerves is damaged.  Common symptoms include: °· Loss of feeling (numbness) in the feet and hands. °· Tingling in the feet and hands. °· Pain that burns. °· Very sensitive skin. °· Weakness. °· Not being able to move a part of the body (paralysis). °· Muscle twitching. °· Clumsiness or poor coordination. °· Loss of balance. °· Not being able to control your bladder. °· Feeling dizzy. °· Sexual problems. °DIAGNOSIS  °Peripheral neuropathy is a symptom, not a disease. Finding the cause of peripheral neuropathy can be hard. To figure that out, your health care provider will take a medical history and do a physical exam. A neurological exam will also be done. This involves checking things affected by your brain, spinal cord, and nerves (nervous system). For example, your health care provider will check your reflexes, how you move, and what you can feel.  °Other types of tests may also be ordered, such as: °· Blood  tests. °· A test of the fluid in your spinal cord. °· Imaging tests, such as CT scans or an MRI. °· Electromyography (EMG). This test checks the nerves that control muscles. °· Nerve conduction velocity tests. These tests check how fast messages pass through your nerves. °· Nerve biopsy. A small piece of nerve is removed. It is then checked under a microscope. °TREATMENT  °· Medicine is often used to treat peripheral neuropathy. Medicines may include: °¨ Pain-relieving medicines. Prescription or over-the-counter medicine may be suggested. °¨ Antiseizure medicine. This may be used for pain. °¨ Antidepressants. These also may help ease pain from neuropathy. °¨ Lidocaine. This is a numbing medicine. You might wear a patch or be given a shot. °¨ Mexiletine. This medicine is typically used to help control irregular heart rhythms. °· Surgery. Surgery may be needed to relieve pressure on a nerve or to destroy a nerve that is causing pain. °· Physical therapy to help movement. °· Assistive devices to help movement. °HOME CARE INSTRUCTIONS  °· Only take over-the-counter or prescription medicines as directed by your health care provider. Follow the instructions carefully for any given medicines. Do not take any other medicines without first getting approval from your health care provider. °· If you have diabetes, work closely with your health care provider to keep your blood sugar under control. °· If you have numbness in your feet: °¨ Check every day for signs of injury or infection. Watch for redness, warmth, and swelling. °¨ Wear padded socks and comfortable shoes. These help protect your feet. °· Do not do   things that put pressure on your damaged nerve.  Do not smoke. Smoking keeps blood from getting to damaged nerves.  Avoid or limit alcohol. Too much alcohol can cause a lack of B vitamins. These vitamins are needed for healthy nerves.  Develop a good support system. Coping with peripheral neuropathy can be  stressful. Talk to a mental health specialist or join a support group if you are struggling.  Follow up with your health care provider as directed. SEEK MEDICAL CARE IF:   You have new signs or symptoms of peripheral neuropathy.  You are struggling emotionally from dealing with peripheral neuropathy.  You have a fever. SEEK IMMEDIATE MEDICAL CARE IF:   You have an injury or infection that is not healing.  You feel very dizzy or begin vomiting.  You have chest pain.  You have trouble breathing. Document Released: 03/01/2002 Document Revised: 11/21/2010 Document Reviewed: 11/16/2012 [facility] Patient Information 2015 [place], [place]. This information is not intended to replace advice given to you by your health care provider. Make sure you discuss any questions you have with your health care provider.  Emergency Department Resource Guide 1) Find a Doctor and Pay Out of Pocket Although you won't have to find out who is covered by your insurance plan, it is a good idea to ask around and get recommendations. You will then need to call the office and see if the doctor you have chosen will accept you as a new patient and what types of options they offer for patients who are self-pay. Some doctors offer discounts or will set up payment plans for their patients who do not have insurance, but you will need to ask so you aren't surprised when you get to your appointment.  2) Contact Your Local Health Department Not all health departments have doctors that can see patients for sick visits, but many do, so it is worth a call to see if yours does. If you don't know where your local health department is, you can check in your phone book. The CDC also has a tool to help you locate your state's health department, and many state websites also have listings of all of their local health departments.  3) Find a [facility] If your illness is not likely to be very severe or complicated, you may want to try  a walk in clinic. These are popping up all over the country in pharmacies, drugstores, and shopping centers. They're usually staffed by nurse practitioners or physician assistants that have been trained to treat common illnesses and complaints. They're usually fairly quick and inexpensive. However, if you have serious medical issues or chronic medical problems, these are probably not your best option.  No Primary Care Doctor: - Call Health Connect at  [phone] - they can help you locate a primary care doctor that  accepts your insurance, provides certain services, etc. - Physician Referral Service- [phone]  Chronic Pain Problems: Organization         Address  Phone   Notes  [facility]  [phone] Patients need to be referred by their primary care doctor.   Medication Assistance: Organization         Address  Phone   Notes  [facility] Medication [facility] [address] [place], [place] [address] [phone] --Must be a resident of [facility] -- Must have NO insurance coverage whatsoever (no Medicaid/ Medicare, etc.) -- The pt. MUST have a primary care doctor that  directs their care regularly and follows them in the community   [facility]ist  [phone]   [employer]  [phone]    Agencies that provide inexpensive medical care: Organization         Address  Phone   Notes  [place]  [phone]   [facility]    [phone]   [facility] [place], [place] [address] [phone]   [place] [address] [place] [address], [place] [phone]   Planned Parenthood    [phone]   [facility]    [phone]   [place] and [place] [address], [place] Phone:  [phone], Fax:  [phone] Hours of Operation:  9 am - 6 pm, M-F.  Also accepts Medicaid/Medicare and self-pay.  [facility] for [place], [address], [place] Phone: [phone], Fax: [phone]. Hours of Operation:  8:30 am - 5:30 pm, M-F.  Also accepts Medicaid and self-pay.  [facility] [place] [address], [place] Phone: [phone]   [place], [place], [place] [phone], Ext. [phone] Mondays & Thursdays: 7-9 AM.  First 15 patients are seen on a first come, first serve basis.    [place] Providers:  Organization         Address  Phone   Notes  [facility] [address], [place] [phone] Also accepts self-pay patients.  [facility] [address], [place]  [phone]   [place], [address], [place] [phone]   [facility] [address] [place] [phone]   Lucianne Lei [address], [place]   [phone] Only accepts [place] Access [place] patients after they have their name applied to their card.   Self-Pay (no insurance) in [facility]:  Organization         Address  Phone   Notes  Sickle Cell Patients, [facility] Internal Medicine [place] [phone]   [facility] Urgent Care [place] [phone]   [facility] [place]  [place], [place], [place] [phone]   [facility]/Dr. Osei-Bonsu  [address], [place] or [address], [place] [phone] Phone number for both [place] and [place] locations is the same.  Urgent Medical and [facility] [address] [place] [phone]   [facility] [address], [place] or [address] [phone]   [facility] [address] [place] [phone], phone; [phone], fax  Sees patients 1st and 3rd Saturday of every month.  Must not qualify for public or private insurance (i.e. Medicaid, Medicare, [place] Health Choice, Veterans' Benefits)  Household income should be no more than 200% of the poverty level The clinic cannot treat you if you are pregnant or think you are pregnant  Sexually transmitted diseases are not treated at the clinic.    Dental Care: Organization         Address  Phone  Notes  [facility] [address] [place], [place] [phone] Accepts children up  to age 66 who are enrolled in Medicaid or [place] Health Choice; pregnant women with a Medicaid card; and children who have applied for Medicaid or [place] Health Choice, but were declined, whose parents can pay a reduced fee at time of service.  [facility] of [facility]  [address], [place] [phone] Accepts children up to age 82 who are enrolled in [place] or [place]; pregnant women with a Medicaid card; and children who have applied for Medicaid or [place] Health Choice, but were declined, whose parents can pay a reduced fee at time of service.  [facility] PRO[facility]  [place] [phone] Patients are seen by appointment only. Walk-ins are not accepted. [place] will see patients 52 years of age and older. Monday - Tuesday (8am-5pm) Most Wednesdays (8:30-5pm) $30 per visit, cash only  [facility] Adult Dental Access PROGRAM  [address], [facility] [phone] Patients are seen by appointment only. Walk-ins are not accepted. [place] will see patients 89 years of age and older. One Wednesday Evening (Monthly: Volunteer Based).  $30 per visit, cash only  [place]  [phone] for adults; Children under age 47, call Graduate Pediatric Dentistry at [phone]. Children aged 10-14, please call [phone] to  request a pediatric application.  Dental services are provided in all areas of dental care including fillings, crowns and bridges, complete and partial dentures, implants, gum treatment, root canals, and extractions. Preventive care is also provided. Treatment is provided to both adults and children. Patients are selected via a lottery and there is often a waiting list.   [facility] [address], [place]  [phone] [email or web address]   [facility] [address] [place], [place] [phone], Ext. [phone] Second and Fourth Thursday of each month, opens at 6:30 AM; Clinic ends at 9 AM.  Patients are seen on a first-come first-served basis, and a limited number are seen during each clinic.   [facility]  [address] [place] Danker [place], [place] [phone]   Eligibility Requirements You must have lived in [place], [place], or [place] counties for at least the last three months.   You cannot be eligible for state or federal sponsored [employer], including [employer], [place], or [employer].   You generally cannot be eligible for healthcare insurance through your employer.    How to apply: Eligibility screenings are held every Tuesday and Wednesday afternoon from 1:00 pm until 4:00 pm. You do not need an appointment for the interview!  [facility] [address], [place], [place]   [place]  [place] Department  [place]  [phone]    Behavioral Health Resources in the Community: Intensive Outpatient Programs Organization         Address  Phone  Notes  [place]. [address], [place], [place] [phone]   [facility] Outpatient [address] [place], [place]   ADS: Alcohol & Drug Svcs [address] [place] [address], [place], [place]   [place] [address],  [place], [place] or [phone]   Substance Abuse Resources Organization         Address  Phone  Notes  Alcohol and Drug Services  [phone]   Addiction Recovery  [facility]  [phone]   [facility] [place]   Chinita Pester  [phone]   Residential & Outpatient Substance Abuse Program  [phone]   Psychological Services Organization         Address  Phone  Notes  [facility] [place]  [place]  [phone]   [place] [address] [place] or [phone]    Mobile Crisis Teams Organization         Address  Phone  Notes  [facility], Mobile Crisis Care Unit  [phone]   [facility]  [address] [place], [place]   [place] [address], [place] [phone]    Self-Help/Support Groups Organization         Address  Phone             Notes  [place]. of [place] - variety of support groups  [place] Call for more information  Narcotics Anonymous (NA), [facility] [address], [employer] [place]  2 meetings at this location   Special educational needs teacher         Address  Phone  Notes  [facility] [place],    [place]  [phone]   [employer]  [address] [place] [phone], [place], [place]   [place], [place] [phone] Admissions: 8am-3pm M-F  Incentives Substance [place] [address]    [place], [place] [phone]   [facility] [address] [place], [place], [place]   [facility] [address],  [place], [place]   [facility] - Intensive Outpatient [address] Kristeen Mans [address], [place], [place]   [facility] ([place].) [place].,  [place], [place] [phone] or [phone]   [facility] Services (RTS) [address], [place], [place] Accepts Medicaid  [facility] [place] [address],  [place] [phone] Substance Abuse/Addiction Treatment   [facility] Organization         Address  Phone  Notes  [facility]  [phone]   Domenic Schwab, PhD [address] [place]lis [place], [place]   [phone] or [phone]   [place], [place] [phone]   [facility] [address] [place], [place] [phone] Insurance/Medicaid/sponsorship through [facility] and Families [address] [place]                                    [place], [place] [phone] [place] [address], [place] [phone]    Dr. Adele Schilder  [phone]   [facility] Dept. 1) [address], [place] 2) [place] 3)  [place] [address], [place] [phone]  [phone]   [place] [phone] or [phone] (After Hours)     Please take the medication as directed until completed You have been given a resource list to help you find a PCP

## 2014-02-16 ENCOUNTER — Encounter (HOSPITAL_COMMUNITY): Payer: Self-pay | Admitting: Emergency Medicine

## 2014-02-16 ENCOUNTER — Emergency Department (HOSPITAL_COMMUNITY)
Admission: EM | Admit: 2014-02-16 | Discharge: 2014-02-16 | Disposition: A | Discharge status: Home / Self Care | Payer: Medicaid Other | Attending: Emergency Medicine | Admitting: Emergency Medicine

## 2014-02-16 DIAGNOSIS — Z79899 Other long term (current) drug therapy: Secondary | ICD-10-CM | POA: Insufficient documentation

## 2014-02-16 DIAGNOSIS — Z7952 Long term (current) use of systemic steroids: Secondary | ICD-10-CM | POA: Diagnosis not present

## 2014-02-16 DIAGNOSIS — M5416 Radiculopathy, lumbar region: Secondary | ICD-10-CM | POA: Diagnosis not present

## 2014-02-16 DIAGNOSIS — M545 Low back pain: Secondary | ICD-10-CM | POA: Diagnosis present

## 2014-02-16 MED ORDER — METAXALONE 800 MG PO TABS: 800.0000 mg | ORAL_TABLET | Freq: Three times a day (TID) | ORAL | Status: DC

## 2014-02-16 MED ORDER — KETOROLAC TROMETHAMINE 30 MG/ML IJ SOLN
30.0000 mg | Freq: Once | INTRAMUSCULAR | Status: AC
  Administered 2014-02-16: 30 mg via INTRAMUSCULAR
  Filled 2014-02-16: qty 1

## 2014-02-16 MED ORDER — HYDROCODONE-ACETAMINOPHEN 5-325 MG PO TABS: 1.0000 | ORAL_TABLET | Freq: Four times a day (QID) | ORAL | Status: AC | PRN

## 2014-02-16 NOTE — Discharge Instructions (Signed)
Back Exercises Back exercises help treat and prevent back injuries. The goal is to increase your strength in your belly (abdominal) and back muscles. These exercises can also help with flexibility. Start these exercises when told by your doctor. HOME CARE Back exercises include: Pelvic Tilt.  Lie on your back with your knees bent. Tilt your pelvis until the lower part of your back is against the floor. Hold this position 5 to 10 sec. Repeat this exercise 5 to 10 times. Knee to Chest.  Pull 1 knee up against your chest and hold for 20 to 30 seconds. Repeat this with the other knee. This may be done with the other leg straight or bent, whichever feels better. Then, pull both knees up against your chest. Sit-Ups or Curl-Ups.  Bend your knees 90 degrees. Start with tilting your pelvis, and do a partial, slow sit-up. Only lift your upper half 30 to 45 degrees off the floor. Take at least 2 to 3 seonds for each sit-up. Do not do sit-ups with your knees out straight. If partial sit-ups are difficult, simply do the above but with only tightening your belly (abdominal) muscles and holding it as told. Hip-Lift.  Lie on your back with your knees flexed 90 degrees. Push down with your feet and shoulders as you raise your hips 2 inches off the floor. Hold for 10 seconds, repeat 5 to 10 times. Back Arches.  Lie on your stomach. Prop yourself up on bent elbows. Slowly press on your hands, causing an arch in your low back. Repeat 3 to 5 times. Shoulder-Lifts.  Lie face down with arms beside your body. Keep hips and belly pressed to floor as you slowly lift your head and shoulders off the floor. Do not overdo your exercises. Be careful in the beginning. Exercises may cause you some mild back discomfort. If the pain lasts for more than 15 minutes, stop the exercises until you see your doctor. Improvement with exercise for back problems is slow.  Document Released: 04/13/2010 Document Revised: 06/03/2011  Document Reviewed: 01/10/2011 [facility] Patient Information 2015 [place], [place]. This information is not intended to replace advice given to you by your health care provider. Make sure you discuss any questions you have with your health care provider. You have been given additional prescriptions for pain control as well as a stronger muscle relaxer and a set of exercises that you can start at home.  Please call Dr. Rolena Infante office tell them you are being referred through the ED and they will arrange an appointment

## 2014-02-16 NOTE — ED Provider Notes (Signed)
CSN: [id number]     Arrival date & time 02/16/14  2035 History  This chart was scribed for a non-physician practitioner, Garald Balding, NP working with Malvin Johns, MD by [place] Peace, ED Scribe. The patient was seen in WTR5/WTR5. The patient's care was started at 8:21 PM.    Chief Complaint  Patient presents with  . Back Pain      Patient is a 28 y.o. female presenting with back pain. The history is provided by the patient. No language interpreter was used.  Back Pain Associated symptoms: no numbness and no weakness    HPI Comments: Kristen [name] is a 28 y.o. female who presents to the Emergency Department complaining of radiating lower back pain that extends down to her left foot. Pt was seen here at ED on 01/29/2014 regarding same issue and given steroids and muscle relaxers. Pt states medication she was given only provided slight relief but is still in severe pain. Pt reports she does not have anymore steroids left from prescription. She adds that she has not followed up with PCP like she was advised.    History reviewed. No pertinent past medical history. Past Surgical History  Procedure Laterality Date  . Tubal ligation     No family history on file. History  Substance Use Topics  . Smoking status: Never Smoker   . Smokeless tobacco: Not on file  . Alcohol Use: No   OB History    No data available     Review of Systems  Musculoskeletal: Positive for back pain.  Neurological: Negative for weakness and numbness.  All other systems reviewed and are negative.     Allergies  Tramadol  Home Medications   Prior to Admission medications   Medication Sig Start Date End Date Taking? Authorizing Provider  cyclobenzaprine (FLEXERIL) 5 MG tablet Take 1 tablet (5 mg total) by mouth 3 (three) times daily. 01/29/14   Garald Balding, NP  HYDROcodone-acetaminophen (NORCO/VICODIN) 5-325 MG per tablet Take 1-2 tablets by mouth every 6 (six) hours as needed. 02/16/14   Garald Balding, NP  metaxalone (SKELAXIN) 800 MG tablet Take 1 tablet (800 mg total) by mouth 3 (three) times daily. 02/16/14   Garald Balding, NP  predniSONE (DELTASONE) 20 MG tablet 3 Tabs PO Days 1-3, then 2 tabs PO Days 4-6, then 1 tab PO Day 7-9, then Half Tab PO Day 10-12 01/29/14   Garald Balding, NP   BP 129/72 mmHg  Pulse 98  Temp(Src) 98 F (36.7 C) (Oral)  Resp 18  SpO2 100%  LMP 01/29/2014 Physical Exam  Constitutional: She is oriented to person, place, and time. She appears well-developed and well-nourished. No distress.  HENT:  Head: Normocephalic and atraumatic.  Eyes: Pupils are equal, round, and reactive to light.  Neck: Normal range of motion.  Cardiovascular: Normal rate and regular rhythm.   Pulmonary/Chest: Effort normal.  Musculoskeletal: Normal range of motion. She exhibits tenderness. She exhibits no edema.       Back:  Neurological: She is alert and oriented to person, place, and time.  Skin: Skin is warm and dry. No rash noted.  Psychiatric: She has a normal mood and affect. Her behavior is normal.  Nursing note and vitals reviewed.   ED Course  Procedures (including critical care time) Labs Review Labs Reviewed - No data to display  Imaging Review No results found.   EKG Interpretation None      Medications  ketorolac (TORADOL) 30  MG/ML injection 30 mg (not administered)     8:24 PM- Treatment plan was discussed with patient who verbalizes understanding and agrees.   MDM  At this time after 2 weeks of medical therapy I will refer patient to Ortho for evaluations and physical therapy  Final diagnoses:  Lumbar radiculopathy       I personally performed the services described in this documentation, which was scribed in my presence. The recorded information has been reviewed and is accurate.   Garald Balding, NP 02/16/14 2039  Malvin Johns, MD 02/16/14 2256

## 2014-02-16 NOTE — ED Notes (Signed)
Pt seen here for the same on 01/29/2014 and is c/o of lower back pain that radiates down to her left foot. Pt says meds are not helping and she did not follow up with Pcp.

## 2014-02-17 MED ORDER — METHOCARBAMOL 500 MG PO TABS: 500.0000 mg | ORAL_TABLET | Freq: Three times a day (TID) | ORAL | Status: AC | PRN

## 2014-02-17 NOTE — ED Provider Notes (Signed)
Insurance required letter for metaxalone. Pt prescribed robaxin instead. Faxed into pharmacy  Hoy Morn, MD 02/17/14 [phone]

## 2017-10-03 ENCOUNTER — Inpatient Hospital Stay
Admit: 2017-10-03 | Discharge: 2017-10-03 | Disposition: A | Discharge status: Home / Self Care | Payer: Self-pay | Attending: Emergency Medicine

## 2017-10-03 DIAGNOSIS — K130 Diseases of lips: Secondary | ICD-10-CM

## 2017-10-03 NOTE — ED Triage Notes (Signed)
Pt ambulatory to triage without complications. Pt reports two "spots" that have come up on her lip. One a month ago and one a few days ago. Pt denies that they itch or pain to the area. Pt has no other spots that look similar. Areas look similar to small bruise. Pt states she had area on her left arm a few years ago.

## 2017-10-03 NOTE — ED Provider Notes (Signed)
Patient presents to the ER complaining of 2 lower lip lesions on right side.  One lesion appeared one month ago and appears to be growing in size, the other appeared a few days ago.  Wounds do not itch, no trauma or drainage.  No swelling just darker pigmentation.  Patient denies weight loss, fever, n/v.  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           History reviewed. No pertinent past medical history.    History reviewed. No pertinent surgical history.      History reviewed. No pertinent family history.    Social History     Socioeconomic History   ??? Marital status: MARRIED     Spouse name: Not on file   ??? Number of children: Not on file   ??? Years of education: Not on file   ??? Highest education level: Not on file   Occupational History   ??? Not on file   Social Needs   ??? Financial resource strain: Not on file   ??? Food insecurity:     Worry: Not on file     Inability: Not on file   ??? Transportation needs:     Medical: Not on file     Non-medical: Not on file   Tobacco Use   ??? Smoking status: Never Smoker   ??? Smokeless tobacco: Never Used   Substance and Sexual Activity   ??? Alcohol use: Not Currently   ??? Drug use: Not Currently   ??? Sexual activity: Not on file   Lifestyle   ??? Physical activity:     Days per week: Not on file     Minutes per session: Not on file   ??? Stress: Not on file   Relationships   ??? Social connections:     Talks on phone: Not on file     Gets together: Not on file     Attends religious service: Not on file     Active member of club or organization: Not on file     Attends meetings of clubs or organizations: Not on file     Relationship status: Not on file   ??? Intimate partner violence:     Fear of current or ex partner: Not on file     Emotionally abused: Not on file     Physically abused: Not on file     Forced sexual activity: Not on file   Other Topics Concern   ??? Not on file   Social History Narrative   ??? Not on file         ALLERGIES: Tramadol     Review of Systems   Constitutional: Negative for chills, fatigue and fever.   Respiratory: Negative for cough and shortness of breath.    Genitourinary: Negative for dysuria.   Musculoskeletal: Negative for arthralgias, back pain and gait problem.   Skin: Positive for color change. Negative for rash and wound.   Neurological: Negative for dizziness and headaches.       Vitals:    10/03/17 1632   BP: 137/63   Pulse: 91   Resp: 16   Temp: 98.3 ??F (36.8 ??C)   SpO2: 99%   Weight: 68 kg (150 lb)   Height: 5\' 8"  (1.727 m)            Physical Exam   Constitutional: She appears well-developed and well-nourished.   Healthy, in no  acute distress.  Friend at bedside.   HENT:   Head: Normocephalic.       Mouth/Throat:       Eyes: Pupils are equal, round, and reactive to light. Conjunctivae are normal.   Neck: Normal range of motion. Neck supple.   Lymphadenopathy:     She has no cervical adenopathy.   Nursing note and vitals reviewed.       MDM  Number of Diagnoses or Management Options  Lesion of lip: new and requires workup  Risk of Complications, Morbidity, and/or Mortality  Presenting problems: moderate  Diagnostic procedures: moderate  Management options: moderate  General comments: History is as the ER complaining of 2 hyperpigmented lesions on the right aspect of lower lip. Patient's appear to be growing in size and darkening.  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  Was urged to follow up with a dermatologist or primary care doctor as soon as possible.  Local dermatology group listed on discharge paperwork.    Patient Progress  Patient progress: stable         Procedures

## 2017-10-03 NOTE — ED Notes (Signed)
I have reviewed discharge instructions with the patient.  The patient verbalized understanding.    Patient left ED via Discharge Method: ambulatory to Home with self and friend.  Opportunity for questions and clarification provided.       Patient given 0 scripts.         To continue your aftercare when you leave the hospital, you may receive an automated call from our care team to check in on how you are doing.  This is a free service and part of our promise to provide the best care and service to meet your aftercare needs.??? If you have questions, or wish to unsubscribe from this service please call [phone].  Thank you for Choosing our [facility].

## 2017-10-03 NOTE — ED Notes (Signed)
I have reviewed discharge instructions with the patient.  The patient verbalized understanding.    Patient left ED via Discharge Method: ambulatory to Home with self and friend.  Opportunity for questions and clarification provided.       Patient given 0 scripts.         To continue your aftercare when you leave the hospital, you may receive an automated call from our care team to check in on how you are doing.  This is a free service and part of our promise to provide the best care and service to meet your aftercare needs." If you have questions, or wish to unsubscribe from this service please call [phone].  Thank you for Choosing our [facility] Emergency Department.

## 2017-10-03 NOTE — ED Notes (Signed)
Pt ambulatory to triage without complications. Pt reports two "spots" that have come up on her lip. One a month ago and one a few days ago. Pt denies that they itch or pain to the area. Pt has no other spots that look similar. Areas look similar to small bruise. Pt states she had area on her left arm a few years ago.

## 2017-10-03 NOTE — ED Provider Notes (Signed)
ED Provider Notes by Bryson CoronaMiller, Natalie, PA at 10/03/17 1739                Author: Bryson CoronaMiller, Natalie, PA  Service: Emergency Medicine  Author Type: Physician Assistant       Filed: 10/03/17 1745  Date of Service: 10/03/17 1739  Status: Attested           Editor: Bryson CoronaMiller, Natalie, PA (Physician Assistant)  Cosigner: Anne HahnZellner, Dawn L, MD at 10/03/17 2109          Attestation signed by Anne HahnZellner, Dawn L, MD at 10/03/17 2109          I was personally available for consultation in the emergency department.  I have reviewed the chart and agree with the documentation recorded by the [facility], including  the assessment, treatment plan, and disposition.   Anne Hahnawn L Zellner, MD                                 Patient presents to the ER complaining of 2 lower lip lesions on right side.  One lesion appeared one month ago  and appears to be growing in size, the other appeared a few days ago.  Wounds do not itch, no trauma or drainage.  No swelling just darker pigmentation.   Patient denies weight loss, fever, n/v.  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                History reviewed. No pertinent past medical history.      History reviewed. No pertinent surgical history.        History reviewed. No pertinent family history.        Social History          Socioeconomic History         ?  Marital status:  MARRIED              Spouse name:  Not on file         ?  Number of children:  Not on file     ?  Years of education:  Not on file     ?  Highest education level:  Not on file       Occupational History        ?  Not on file       Social Needs         ?  Financial resource strain:  Not on file        ?  Food insecurity:              Worry:  Not on file         Inability:  Not on file        ?  Transportation needs:              Medical:  Not on file         Non-medical:  Not on file       Tobacco Use         ?  Smoking status:  Never Smoker     ?  Smokeless tobacco:  Never Used        Substance and Sexual Activity         ?  Alcohol use:  Not Currently     ?  Drug use:  Not Currently     ?  Sexual activity:  Not on file       Lifestyle        ?  Physical activity:              Days per week:  Not on file         Minutes per session:  Not on file         ?  Stress:  Not on file       Relationships        ?  Social connections:              Talks on phone:  Not on file         Gets together:  Not on file         Attends religious service:  Not on file         Active member of club or organization:  Not on file         Attends meetings of clubs or organizations:  Not on file         Relationship status:  Not on file        ?  Intimate partner violence:              Fear of current or ex partner:  Not on file         Emotionally abused:  Not on file         Physically abused:  Not on file         Forced sexual activity:  Not on file        Other Topics  Concern        ?  Not on file       Social History Narrative        ?  Not on file              ALLERGIES: Tramadol      Review of Systems    Constitutional: Negative for chills, fatigue and fever.    Respiratory: Negative for cough and shortness of breath.     Genitourinary: Negative for dysuria.    Musculoskeletal: Negative for arthralgias, back pain and gait problem.    Skin: Positive for color change. Negative for rash and wound.    Neurological: Negative for dizziness and headaches.            Vitals:          10/03/17 1632        BP:  137/63     Pulse:  91     Resp:  16     Temp:  98.3 ??F (36.8 ??C)     SpO2:  99%     Weight:  68 kg (150 lb)        Height:  5\' 8"  (1.727 m)                Physical Exam    Constitutional: She appears well-developed and well-nourished.   Healthy, in no acute distress.  Friend at bedside.    HENT:    Head: Normocephalic.          Mouth/Throat:          Eyes: Pupils are equal, round, and reactive to light. Conjunctivae are normal.    Neck: Normal range of motion. Neck supple.   Lymphadenopathy:     She has no cervical  adenopathy.    Nursing note and vitals reviewed.  MDM   Number of Diagnoses or Management Options   Lesion of lip: new  and requires workup   Risk of Complications, Morbidity, and/or Mortality   Presenting problems: moderate  Diagnostic procedures: moderate  Management options: moderate  General  comments: History is as the ER complaining of 2 hyperpigmented lesions on the right aspect of lower lip. Patient's appear to be growing in size and darkening.  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  Was urged  to follow up with a dermatologist or primary care doctor as soon as possible.   Local dermatology group listed on discharge paperwork.      Patient Progress   Patient progress: stable             Procedures

## 2017-10-04 ENCOUNTER — Emergency Department (HOSPITAL_COMMUNITY)
Admission: EM | Admit: 2017-10-04 | Discharge: 2017-10-04 | Disposition: A | Discharge status: Home / Self Care | Payer: Medicaid Other | Attending: Emergency Medicine | Admitting: Emergency Medicine

## 2017-10-04 ENCOUNTER — Encounter (HOSPITAL_COMMUNITY): Payer: Self-pay | Admitting: Emergency Medicine

## 2017-10-04 DIAGNOSIS — K1370 Unspecified lesions of oral mucosa: Secondary | ICD-10-CM | POA: Insufficient documentation

## 2017-10-04 DIAGNOSIS — K130 Diseases of lips: Secondary | ICD-10-CM

## 2017-10-04 MED ORDER — VALACYCLOVIR HCL 1 G PO TABS: 1000.0000 mg | ORAL_TABLET | Freq: Three times a day (TID) | ORAL | 0 refills | Status: AC

## 2017-10-04 MED ORDER — MUPIROCIN CALCIUM 2 % EX CREA: 1.0000 "application " | TOPICAL_CREAM | Freq: Two times a day (BID) | CUTANEOUS | 0 refills | Status: AC

## 2017-10-04 NOTE — Discharge Instructions (Addendum)
We are going to treat your lip lesions as a possible infection, though I suspect this is more so due to local sun damage.  It is very important that you apply sunblock and avoid direct contact with the son.  As we discussed, given your history, I am also concerned about possible early skin cancer.  It is critically important that you follow-up with a dentist or surgeon.  I have referred you to Dr. Hoyt Koch, on the sheet above.  If you are unable to get in with him, given you a sheet of dental providers in the area.  It is very important that you follow-up in the next 1 to 2 weeks, for biopsy and further evaluation.  If you are unable to get in, please call for assistance or return to the ER.

## 2017-10-04 NOTE — ED Triage Notes (Signed)
Pt report dark spots on her lower lip, first lesion appeared 1 month ago and now a second one has appeared. Denies pain.

## 2017-10-04 NOTE — ED Provider Notes (Signed)
[facility] EMERGENCY DEPARTMENT Provider Note   CSN: [id number] Arrival date & time: 10/04/17  5427     History   Chief Complaint Chief Complaint  Patient presents with  . Mouth Lesions    HPI Margree Gimbel is a 32 y.o. female.  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  She states that over the last month, she has developed 2 new, painless, hyperpigmented lesions on her lower lip.  She denies any pain.  She has not noticed any mouth sores.  She has not had any fever, chills, night sweats, or weight loss.  She is in the sun a lot, but denies any direct sunburn to the lip.  She has not tried or placed anything on it.  She does not have a regular dentist.  No other medical complaints.  No pain.  History reviewed. No pertinent past medical history.  There are no active problems to display for this patient.   Past Surgical History:  Procedure Laterality Date  . TUBAL LIGATION       OB History   None      Home Medications    Prior to Admission medications   Medication Sig Start Date End Date Taking? Authorizing Provider  cyclobenzaprine (FLEXERIL) 5 MG tablet Take 1 tablet (5 mg total) by mouth 3 (three) times daily. 01/29/14   Junius Creamer, NP  HYDROcodone-acetaminophen (NORCO/VICODIN) 5-325 MG per tablet Take 1-2 tablets by mouth every 6 (six) hours as needed. 02/16/14   Junius Creamer, NP  methocarbamol (ROBAXIN) 500 MG tablet Take 1 tablet (500 mg total) by mouth every 8 (eight) hours as needed for muscle spasms. 02/17/14   Jola Schmidt, MD  mupirocin cream (BACTROBAN) 2 % Apply 1 application topically 2 (two) times daily. Apply to lip lesions 10/04/17   Duffy Bruce, MD  predniSONE (DELTASONE) 20 MG tablet 3 Tabs PO Days 1-3, then 2 tabs PO Days 4-6, then 1 tab PO Day 7-9, then Half Tab PO Day 10-12 01/29/14   Junius Creamer, NP  valACYclovir (VALTREX) 1000 MG tablet  Take 1 tablet (1,000 mg total) by mouth 3 (three) times daily for 7 days. 10/04/17 10/11/17  Duffy Bruce, MD    Family History No family history on file.  Social History Social History   Tobacco Use  . Smoking status: Never Smoker  Substance Use Topics  . Alcohol use: No  . Drug use: No     Allergies   Tramadol   Review of Systems Review of Systems  Constitutional: Negative for chills, fatigue and fever.  HENT: Positive for mouth sores. Negative for congestion and rhinorrhea.   Eyes: Negative for visual disturbance.  Respiratory: Negative for cough, shortness of breath and wheezing.   Cardiovascular: Negative for chest pain and leg swelling.  Gastrointestinal: Negative for abdominal pain, diarrhea, nausea and vomiting.  Genitourinary: Negative for dysuria and flank pain.  Musculoskeletal: Negative for neck pain and neck stiffness.  Skin: Positive for rash. Negative for wound.  Allergic/Immunologic: Negative for immunocompromised state.  Neurological: Negative for syncope, weakness and headaches.  All other systems reviewed and are negative.    Physical Exam Updated Vital Signs BP 115/70 (BP Location: Right Arm)   Pulse 69   Temp 98.6 F (37 C) (Oral)   Resp 18   LMP 09/10/2017 (Approximate)   SpO2 100%   Physical Exam  Constitutional: She is oriented to person, place,  and time. She appears well-developed and well-nourished. No distress.  HENT:  Head: Normocephalic and atraumatic.  Markedly poor dentition diffusely.  There are 2 hyperpigmented macules along the mucosal border of the lower lip.  They are smooth, with rounded and irregular edges.  No overlying blood vessel changes.  No skin ulcerations.  Eyes: Conjunctivae are normal.  Neck: Neck supple.  Cardiovascular: Normal rate, regular rhythm and normal heart sounds. Exam reveals no friction rub.  No murmur heard. Pulmonary/Chest: Effort normal and breath sounds normal. No respiratory distress. She has no  wheezes. She has no rales.  Abdominal: She exhibits no distension.  Musculoskeletal: She exhibits no edema.  Neurological: She is alert and oriented to person, place, and time. She exhibits normal muscle tone.  Skin: Skin is warm. Capillary refill takes less than 2 seconds.  Psychiatric: She has a normal mood and affect.  Nursing note and vitals reviewed.    ED Treatments / Results  Labs (all labs ordered are listed, but only abnormal results are displayed) Labs Reviewed - No data to display  EKG None  Radiology No results found.  Procedures Procedures (including critical care time)  Medications Ordered in ED Medications - No data to display   Initial Impression / Assessment and Plan / ED Course  I have reviewed the triage vital signs and the nursing notes.  Pertinent labs & imaging results that were available during my care of the patient were reviewed by me and considered in my medical decision making (see chart for details).     32 year old female here with lower lip, hyperpigmented lesions.  Suspect possible melanosis, though primary melanoma or skin cancer is also on the differential.  Patient also with some erythematous lesions and markedly poor dentition.  Will treat for possible herpetic lesions, though hyperpigmented nature is less likely.  She has diffusely poor dentition but denies methamphetamine abuse.  Will give topical ABX for other superficial skin excoriations/lesions. Will refer to oral surgeon.  I discussed the strict importance of good follow-up, including my consideration of possible underlying cancer.  I have placed an ambulatory referral and placed a call to the on-call oral surgeon, Dr. Hoyt Koch.  Final Clinical Impressions(s) / ED Diagnoses   Final diagnoses:  Lip lesion    ED Discharge Orders        Ordered    Ambulatory referral to Oral Maxillofacial Surgery    Comments:  Patient needs urgent 1-2 week follow-up for pigmented skin lesion on lip,  needs evaluation, possibly biopsy/melanoma evaluation   10/04/17 0833    valACYclovir (VALTREX) 1000 MG tablet  3 times daily     10/04/17 0905    mupirocin cream (BACTROBAN) 2 %  2 times daily     10/04/17 0905       Duffy Bruce, MD 10/04/17 [phone]
